# Patient Record
Sex: Male | Born: 1980
Health system: Southern US, Community
[De-identification: ages and names within clinical notes are randomized; demographics above are authoritative.]

## PROBLEM LIST (undated history)

## (undated) DIAGNOSIS — B019 Varicella without complication: Secondary | ICD-10-CM

## (undated) DIAGNOSIS — R030 Elevated blood-pressure reading, without diagnosis of hypertension: Secondary | ICD-10-CM

## (undated) DIAGNOSIS — K219 Gastro-esophageal reflux disease without esophagitis: Secondary | ICD-10-CM

## (undated) DIAGNOSIS — I10 Essential (primary) hypertension: Secondary | ICD-10-CM

## (undated) DIAGNOSIS — F419 Anxiety disorder, unspecified: Secondary | ICD-10-CM

## (undated) HISTORY — DX: Elevated blood-pressure reading, without diagnosis of hypertension: R03.0

## (undated) HISTORY — DX: Gastro-esophageal reflux disease without esophagitis: K21.9

## (undated) HISTORY — PX: OTHER SURGICAL HISTORY: SHX169

## (undated) HISTORY — PX: NO PAST SURGERIES: SHX2092

## (undated) HISTORY — PX: JOINT REPLACEMENT: SHX530

## (undated) HISTORY — DX: Varicella without complication: B01.9

---

## 2016-06-28 ENCOUNTER — Encounter: Payer: Self-pay | Admitting: Family Medicine

## 2016-06-28 ENCOUNTER — Ambulatory Visit (INDEPENDENT_AMBULATORY_CARE_PROVIDER_SITE_OTHER): Payer: BLUE CROSS/BLUE SHIELD | Admitting: Family Medicine

## 2016-06-28 VITALS — BP 138/78 | HR 81 | Temp 98.3°F | Ht 74.25 in | Wt 203.8 lb

## 2016-06-28 DIAGNOSIS — R030 Elevated blood-pressure reading, without diagnosis of hypertension: Secondary | ICD-10-CM

## 2016-06-28 DIAGNOSIS — I10 Essential (primary) hypertension: Secondary | ICD-10-CM | POA: Insufficient documentation

## 2016-06-28 DIAGNOSIS — K219 Gastro-esophageal reflux disease without esophagitis: Secondary | ICD-10-CM | POA: Diagnosis not present

## 2016-06-28 DIAGNOSIS — Z0000 Encounter for general adult medical examination without abnormal findings: Secondary | ICD-10-CM | POA: Diagnosis not present

## 2016-06-28 LAB — LIPID PANEL
CHOL/HDL RATIO: 3
Cholesterol: 233 mg/dL — ABNORMAL HIGH (ref 0–200)
HDL: 70 mg/dL (ref >39.00)
LDL CALC: 150 mg/dL — AB (ref 0–99)
NONHDL: 163.45
TRIGLYCERIDES: 66 mg/dL (ref 0.0–149.0)
VLDL: 13.2 mg/dL (ref 0.0–40.0)

## 2016-06-28 LAB — CBC
HCT: 46.8 % (ref 39.0–52.0)
Hemoglobin: 16.1 g/dL (ref 13.0–17.0)
MCHC: 34.3 g/dL (ref 30.0–36.0)
MCV: 91 fl (ref 78.0–100.0)
PLATELETS: 234 10*3/uL (ref 150.0–400.0)
RBC: 5.15 Mil/uL (ref 4.22–5.81)
RDW: 13 % (ref 11.5–15.5)
WBC: 5.2 10*3/uL (ref 4.0–10.5)

## 2016-06-28 LAB — POC URINALSYSI DIPSTICK (AUTOMATED)
BILIRUBIN UA: NEGATIVE
GLUCOSE UA: NEGATIVE
KETONES UA: NEGATIVE
Leukocytes, UA: NEGATIVE
Nitrite, UA: NEGATIVE
RBC UA: NEGATIVE
SPEC GRAV UA: 1.015
Urobilinogen, UA: 0.2
pH, UA: 8.5

## 2016-06-28 LAB — COMPREHENSIVE METABOLIC PANEL
ALT: 25 U/L (ref 0–53)
AST: 22 U/L (ref 0–37)
Albumin: 4.6 g/dL (ref 3.5–5.2)
Alkaline Phosphatase: 55 U/L (ref 39–117)
BILIRUBIN TOTAL: 1.1 mg/dL (ref 0.2–1.2)
BUN: 14 mg/dL (ref 6–23)
CHLORIDE: 103 meq/L (ref 96–112)
CO2: 30 meq/L (ref 19–32)
CREATININE: 1.03 mg/dL (ref 0.40–1.50)
Calcium: 9.6 mg/dL (ref 8.4–10.5)
GFR: 87.67 mL/min (ref >60.00)
Glucose, Bld: 90 mg/dL (ref 70–99)
Potassium: 4 mEq/L (ref 3.5–5.1)
SODIUM: 139 meq/L (ref 135–145)
Total Protein: 7.5 g/dL (ref 6.0–8.3)

## 2016-06-28 NOTE — Patient Instructions (Addendum)
Blood pressure on high side but not enough to diagnose hypertension  Labs before you leave  Would advise 150 minutes of exercise a week to help lower BP as well as DASH eating plan  Sign for mychart   Goal <140/90 to stay off medicine- update me in 8 weeks with a few home blood pressures from wife  And also at that time tell me how behind the ear is doing- dermatology referral if hasnt resolved  DASH Eating Plan DASH stands for "Dietary Approaches to Stop Hypertension." The DASH eating plan is a healthy eating plan that has been shown to reduce high blood pressure (hypertension). Additional health benefits may include reducing the risk of type 2 diabetes mellitus, heart disease, and stroke. The DASH eating plan may also help with weight loss. WHAT DO I NEED TO KNOW ABOUT THE DASH EATING PLAN? For the DASH eating plan, you will follow these general guidelines:  Choose foods with a percent daily value for sodium of less than 5% (as listed on the food label).  Use salt-free seasonings or herbs instead of table salt or sea salt.  Check with your health care provider or pharmacist before using salt substitutes.  Eat lower-sodium products, often labeled as "lower sodium" or "no salt added."  Eat fresh foods.  Eat more vegetables, fruits, and low-fat dairy products.  Choose whole grains. Look for the word "whole" as the first word in the ingredient list.  Choose fish and skinless chicken or [place] more often than red meat. Limit fish, poultry, and meat to 6 oz (170 g) each day.  Limit sweets, desserts, sugars, and sugary drinks.  Choose heart-healthy fats.  Limit cheese to 1 oz (28 g) per day.  Eat more home-cooked food and less restaurant, buffet, and fast food.  Limit fried foods.  Cook foods using methods other than frying.  Limit canned vegetables. If you do use them, rinse them well to decrease the sodium.  When eating at a restaurant, ask that your food be prepared with  less salt, or no salt if possible. WHAT FOODS CAN I EAT? Seek help from a dietitian for individual calorie needs. Grains Whole grain or whole wheat bread. Brown rice. Whole grain or whole wheat pasta. Quinoa, bulgur, and whole grain cereals. Low-sodium cereals. Corn or whole wheat flour tortillas. Whole grain cornbread. Whole grain crackers. Low-sodium crackers. Vegetables Fresh or frozen vegetables (raw, steamed, roasted, or grilled). Low-sodium or reduced-sodium tomato and vegetable juices. Low-sodium or reduced-sodium tomato sauce and paste. Low-sodium or reduced-sodium canned vegetables.  Fruits All fresh, canned (in natural juice), or frozen fruits. Meat and Other Protein Products Ground beef (85% or leaner), grass-fed beef, or beef trimmed of fat. Skinless chicken or [place]. Ground chicken or [place]. Pork trimmed of fat. All fish and seafood. Eggs. Dried beans, peas, or lentils. Unsalted nuts and seeds. Unsalted canned beans. Dairy Low-fat dairy products, such as skim or 1% milk, 2% or reduced-fat cheeses, low-fat ricotta or cottage cheese, or plain low-fat yogurt. Low-sodium or reduced-sodium cheeses. Fats and Oils Tub margarines without trans fats. Light or reduced-fat mayonnaise and salad dressings (reduced sodium). Avocado. Safflower, olive, or canola oils. Natural peanut or almond butter. Other Unsalted popcorn and pretzels. The items listed above may not be a complete list of recommended foods or beverages. Contact your dietitian for more options. WHAT FOODS ARE NOT RECOMMENDED? Grains White bread. White pasta. White rice. Refined cornbread. Bagels and croissants. Crackers that contain trans fat. Vegetables Creamed or fried  vegetables. Vegetables in a cheese sauce. Regular canned vegetables. Regular canned tomato sauce and paste. Regular tomato and vegetable juices. Fruits Dried fruits. Canned fruit in light or heavy syrup. Fruit juice. Meat and Other Protein Products Fatty cuts  of meat. Ribs, chicken wings, bacon, sausage, bologna, salami, chitterlings, fatback, hot dogs, bratwurst, and packaged luncheon meats. Salted nuts and seeds. Canned beans with salt. Dairy Whole or 2% milk, cream, half-and-half, and cream cheese. Whole-fat or sweetened yogurt. Full-fat cheeses or blue cheese. Nondairy creamers and whipped toppings. Processed cheese, cheese spreads, or cheese curds. Condiments Onion and garlic salt, seasoned salt, table salt, and sea salt. Canned and packaged gravies. Worcestershire sauce. Tartar sauce. Barbecue sauce. Teriyaki sauce. Soy sauce, including reduced sodium. Steak sauce. Fish sauce. Oyster sauce. Cocktail sauce. Horseradish. Ketchup and mustard. Meat flavorings and tenderizers. Bouillon cubes. Hot sauce. Tabasco sauce. Marinades. Taco seasonings. Relishes. Fats and Oils Butter, stick margarine, lard, shortening, ghee, and bacon fat. Coconut, palm kernel, or palm oils. Regular salad dressings. Other Pickles and olives. Salted popcorn and pretzels. The items listed above may not be a complete list of foods and beverages to avoid. Contact your dietitian for more information. WHERE CAN I FIND MORE INFORMATION? National Heart, Lung, and Blood Institute: CablePromo.itwww.nhlbi.nih.gov/health/health-topics/topics/dash/   This information is not intended to replace advice given to you by your health care provider. Make sure you discuss any questions you have with your health care provider.   Document Released: 08/11/2011 Document Revised: 09/12/2014 Document Reviewed: 06/26/2013 Elsevier Interactive Patient Education [employer]2016 Elsevier Inc.

## 2016-06-28 NOTE — Progress Notes (Signed)
Phone: [phone]  Subjective:  Patient presents today to establish care. Chief complaint-noted.   See problem oriented charting  The following were reviewed and entered/updated in epic: Past Medical History:  Diagnosis Date  . Chicken pox   . Elevated blood pressure reading    intermittent elevations when checking at pharmacy  . GERD (gastroesophageal reflux disease)    tums prn   Patient Active Problem List   Diagnosis Date Noted  . Elevated blood pressure reading     Priority: Medium  . GERD (gastroesophageal reflux disease)    Past Surgical History:  Procedure Laterality Date  . none      Family History  Problem Relation Age of Onset  . Healthy Mother   . Hyperlipidemia Father   . Hypertension Father   . Lung cancer Maternal Grandfather     smoker  . Stroke Paternal Grandmother     led to death mid 4250s  . Hypertension Paternal Grandmother   . Dementia Maternal Grandmother   . Glaucoma Paternal Grandfather     Medications- reviewed and updated No current outpatient prescriptions on file.   No current facility-administered medications for this visit.     Allergies-reviewed and updated No Known Allergies  Social History   Social History  . Marital status: Married    Spouse name: N/A  . Number of children: N/A  . Years of education: N/A   Social History Main Topics  . Smoking status: Former Smoker    Packs/day: 1.00    Years: 6.00    Quit date: 09/05/2010  . Smokeless tobacco: Never Used  . Alcohol use Yes     Comment: Social 2-3  . Drug use: No  . Sexual activity: Yes   Other Topics Concern  . None   Social History Narrative   Married 2015. 1st daughter Otila KluverSloane born 05/10/16.       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       Hobbies: working on networking exam- on backburner for now, hiking, enjoys helping folks with iphone, spending time with child, cars     ROS--Full ROS was completed Review of Systems  Constitutional: Negative for chills and fever.  HENT: Negative for hearing loss.   Eyes: Negative for blurred vision and double vision.  Respiratory: Negative for cough and hemoptysis.   Cardiovascular: Negative for chest pain and palpitations.  Gastrointestinal: Negative for heartburn and nausea.  Genitourinary: Negative for dysuria and urgency.  Musculoskeletal: Negative for myalgias and neck pain.  Skin: Negative for itching and rash.  Neurological: Negative for dizziness, tingling and headaches.  Endo/Heme/Allergies: Negative for polydipsia. Does not bruise/bleed easily.  Psychiatric/Behavioral: Negative for depression and suicidal ideas.   Objective: BP 138/78 (BP Location: Left Arm, Patient Position: Sitting, Cuff Size: Large)   Pulse 81   Temp 98.3 F (36.8 C) (Oral)   Ht 6' 2.25" (1.886 m)   Wt 203 lb 12.8 oz (92.4 kg)   SpO2 99%   BMI 25.99 kg/m  Gen: NAD, resting comfortably HEENT: Mucous membranes are moist. Oropharynx normal. TM normal. Behind right ear 2 x 2 x 2 cm area that appears to be prior drained abscess- still swollen but without warmth or pain Eyes: sclera and lids normal, PERRLA Neck: no thyromegaly, no cervical lymphadenopathy CV: RRR no murmurs rubs or gallops Lungs: CTAB no crackles, wheeze, rhonchi Abdomen: soft/nontender/nondistended/normal bowel sounds. No rebound or guarding.  Ext: no  edema Skin: warm, dry Neuro: 5/5 strength in upper and lower extremities, normal gait, normal reflexes  Assessment/Plan:  35 y.o. male presenting for annual physical.  Health Maintenance counseling: 1. Anticipatory guidance: Patient counseled regarding regular dental exams, eye exams- no issues, wearing seatbelts.  2. Risk factor reduction:  Advised patient of need for regular exercise (encouraged this due to blood pressure running on high side- usually 150 minutes a week) and diet rich and fruits and vegetables  to reduce risk of heart attack and stroke. Advised DASH eating plan.  3. Immunizations/screenings/ancillary studies Immunization History  Administered Date(s) Administered  . Influenza-Unspecified 06/07/2016  . Tdap 03/28/2016   4. Prostate cancer screening- no family history, start at age 79   5. Colon cancer screening - no family history, start at age 72 6. Skin cancer screening- waist up exam without obvious cancer or precancer 7. Testicular cancer screening- advised monthly self exams  Behind right ear has a 2 cm x 2 cm lesion that was prior abscess drained by wife with needle- slowly getting smaller- discussed if not resolved within 8 weeks refer to dermatology  Elevated blood pressure reading intermittent elevations when checking at pharmacy BP Readings from Last 3 Encounters:  06/28/16 138/78  discussed prehypertension range- would focus on dash eating and upping exercise- hard with 36 week old at home. Update me in 2 months by mychart and can update me on behind right ear issue as well  1 year CPE   Orders Placed This Encounter  Procedures  . Lipid panel    [place]    Order Specific Question:   Has the patient fasted?    Answer:   No  . CBC    [place]  . Comprehensive metabolic panel    [place]    Order Specific Question:   Has the patient fasted?    Answer:   No  . POCT Urinalysis Dipstick (Automated)   Return precautions advised.  Tana Conch, MD

## 2016-06-28 NOTE — Assessment & Plan Note (Signed)
intermittent elevations when checking at pharmacy BP Readings from Last 3 Encounters:  06/28/16 138/78  discussed prehypertension range- would focus on dash eating and upping exercise- hard with 427 week old at home. Update me in 2 months by mychart and can update me on behind right ear issue as well

## 2016-06-28 NOTE — Progress Notes (Signed)
Pre visit review using our clinic review tool, if applicable. No additional management support is needed unless otherwise documented below in the visit note. 

## 2018-08-23 ENCOUNTER — Encounter: Payer: Self-pay | Admitting: Family Medicine

## 2018-08-23 ENCOUNTER — Ambulatory Visit (INDEPENDENT_AMBULATORY_CARE_PROVIDER_SITE_OTHER): Payer: 59 | Admitting: Family Medicine

## 2018-08-23 VITALS — BP 144/88 | HR 91 | Temp 98.2°F | Ht 74.25 in | Wt 193.8 lb

## 2018-08-23 DIAGNOSIS — R69 Illness, unspecified: Secondary | ICD-10-CM | POA: Diagnosis not present

## 2018-08-23 DIAGNOSIS — F419 Anxiety disorder, unspecified: Secondary | ICD-10-CM | POA: Diagnosis not present

## 2018-08-23 DIAGNOSIS — R030 Elevated blood-pressure reading, without diagnosis of hypertension: Secondary | ICD-10-CM | POA: Diagnosis not present

## 2018-08-23 MED ORDER — BUPROPION HCL ER (XL) 150 MG PO TB24: 150.0000 mg | ORAL_TABLET | Freq: Every day | ORAL | 2 refills | Status: DC

## 2018-08-23 NOTE — Progress Notes (Signed)
Subjective:  Robert SchimkeStephen Ashley is a 37 y.o. year old very pleasant male patient who presents for/with See problem oriented charting ROS-no chest pain, shortness of breath, headache, blurry vision.  No palpitations.  Past Medical History-  Patient Active Problem List   Diagnosis Date Noted  . Elevated blood pressure reading     Priority: Medium  . Anxiety 08/24/2018  . GERD (gastroesophageal reflux disease)     Medications- reviewed and updated No current outpatient medications on file.   No current facility-administered medications for this visit.     Objective: BP (!) 144/88   Pulse 91   Temp 98.2 F (36.8 C) (Oral)   Ht 6' 2.25" (1.886 m)   Wt 193 lb 12.8 oz (87.9 kg)   SpO2 99%   BMI 24.72 kg/m  Gen: NAD, resting comfortably CV: RRR no murmurs rubs or gallops Lungs: CTAB no crackles, wheeze, rhonchi Abdomen: soft/nontender/nondistended Ext: no edema Skin: warm, dry Psych-slightly anxious appearing  Assessment/Plan:   Elevated blood pressure reading S: Poorly controlled on no medication- initial systolic was actually 160  Wellness check at work was 130/70.  BP Readings from Last 3 Encounters:  08/23/18 (!) 144/88  06/28/16 138/78  A/P: We discussed blood pressure goal of <140/90.  Given prior encouraging measurement at home and fact that his wife is a physician's assistant-we are going to do some home monitoring for more information.  We will follow-up in 4 to 6 weeks- I do not want to consider stimulant unless blood pressures less than 135/85.  Stimulants are far more likely to cause hypertension than Wellbutrin though there are some isolated cases of elevations on Wellbutrin.  Anxiety S: Stress/anxiety/depressed mood reported for several months.  Unfortunately this has worsened recently as patients job is in jeopardy- advanced home care was brought out and he will likely have to transition out.  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 Diagn[facility]. He has been able to handle it previously but job s getting really more difficult- getting up and wandering/not listening. He took adderral 20mg  TID at that time and was very helpful.  He thinks the inability to focus is the primary driver of his anxiety and discouragement A/P: Patient reports history of ADD- we need to confirm this diagnosis with prior records are adult evaluation.  If blood pressure is excellent at home-would consider stimulant medication.  While we are working on evaluating blood pressure and getting records-we will try Wellbutrin to help with depressed mood, ADD.  We discussed in some patients that helps with anxiety and and others it makes it worse-we will see how he does. - If we do use stimulants we will need controlled substance contract, UDS, copy of original records in the chart    Patient Instructions  Try to get in touch with [facility]- we need a copy of your original diagnosis of ADD before prescribing stimulant medications.  If you are unable to obtain this-please see handout and call Dr. Jason FilaBray for adult ADD evaluation.  The other concern about prescribing stimulants is your blood pressure-I want you to get a home cuff as tour blood pressure trend concerns me. Check at least 4x a week. Your goal is <135/85 to even consider ADD stimulant medications. If you note  in the next few weeks that it is higher than our goal, see me sooner. Otherwise, see me in 4-6 weeks. Bring your home cuff and your log of blood pressures with you to visit.   I want you to start Wellbutrin-this can help with depressed mood, concentration issues-though not as potent as Adderall-some people will note improvement in anxiety on this medicine while others will note a mild increase in  anxiety.  Please let me know if the increase in anxiety is not tolerable.  Taking the medicine as directed and not missing any doses is one of the best things you can do to treat your attention issues/depressed mood/anxiety.  Here are some things to keep in mind:  1) Side effects (stomach upset, some increased anxiety) may happen before you notice a benefit.  These side effects typically go away over time. 2) Changes to your dose of medicine or a change in medication all together is sometimes necessary 3) Most people need to be on medication at least 6-12 months 4) Many people will notice an improvement within two weeks but the full effect of the medication can take up to 4-6 weeks 5) Stopping the medication when you start feeling better often results in a return of symptoms 6) If you start having thoughts of hurting yourself or others after starting this medicine, call our office immediately at [phone] or seek care through 911.   7. Luckily this medication doesn't have sexual side effects like the other depression/anxiety medicines   Future Appointments  Date Time Provider Department Center  10/05/2018  4:00 PM Shelva MajesticHunter, Ladanian O, MD [facility]  4 to 6-week follow-up evaluation  Lab/Order associations: Elevated blood pressure reading  Anxiety  Meds ordered this encounter  Medications  . buPROPion (WELLBUTRIN XL) 150 MG 24 hr tablet    Sig: Take 1 tablet (150 mg total) by mouth daily.    Dispense:  30 tablet    Refill:  2   Return precautions advised.  Tana ConchStephen Ballard Budney, MD

## 2018-08-23 NOTE — Patient Instructions (Addendum)
Trying to get in touch with [facility]- we need a copy of your original diagnosis of ADD before prescribing stimulant medications.  If you are unable to obtain this-please see handout and call Dr. Jason FilaBray for adult ADD evaluation.  The other concern about prescribing stimulants is your blood pressure-I want you to get a home cuff as tour blood pressure trend concerns me. Check at least 4x a week. Your goal is <135/85 to even consider ADD stimulant medications. If you note in the next few weeks that it is higher than our goal, see me sooner. Otherwise, see me in 4-6 weeks. Bring your home cuff and your log of blood pressures with you to visit.   I want you to start Wellbutrin-this can help with depressed mood, concentration issues-though not as potent as Adderall-some people will note improvement in anxiety on this medicine while others will note a mild increase in anxiety.  Please let me know if the increase in anxiety is not tolerable.  Taking the medicine as directed and not missing any doses is one of the best things you can do to treat your attention issues/depressed mood/anxiety.  Here are some things to keep in mind:  1) Side effects (stomach upset, some increased anxiety) may happen before you notice a benefit.  These side effects typically go away over time. 2) Changes to your dose of medicine or a change in medication all together is sometimes necessary 3) Most people need to be on medication at least 6-12 months 4) Many people will notice an improvement within two weeks but the full effect of the medication can take up to 4-6 weeks 5) Stopping the medication when you start feeling better often results in a return of symptoms 6) If you start having thoughts of hurting yourself or others after starting this medicine, call our office immediately at [phone] or seek care through 911.   7. Luckily this medication doesn't have sexual side effects like the other depression/anxiety medicines

## 2018-08-24 DIAGNOSIS — F419 Anxiety disorder, unspecified: Secondary | ICD-10-CM | POA: Insufficient documentation

## 2018-08-24 DIAGNOSIS — F411 Generalized anxiety disorder: Secondary | ICD-10-CM | POA: Insufficient documentation

## 2018-08-24 NOTE — Assessment & Plan Note (Signed)
S: Poorly controlled on no medication- initial systolic was actually 160  Wellness check at work was 130/70.  BP Readings from Last 3 Encounters:  08/23/18 (!) 144/88  06/28/16 138/78  A/P: We discussed blood pressure goal of <140/90.  Given prior encouraging measurement at home and fact that his wife is a physician's assistant-we are going to do some home monitoring for more information.  We will follow-up in 4 to 6 weeks- I do not want to consider stimulant unless blood pressures less than 135/85.  Stimulants are far more likely to cause hypertension than Wellbutrin though there are some isolated cases of elevations on Wellbutrin.

## 2018-08-24 NOTE — Assessment & Plan Note (Signed)
S: Stress/anxiety/depressed mood reported for several months.  Unfortunately this has worsened recently as patients job is in jeopardy- advanced home care was brought out and he will likely have to transition out.  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 Diagnosed from ps[facility]atrist [facility]. He has been able to handle it previously but job s getting really more difficult- getting up and wandering/not listening. He took adderral 20mg  TID at that time and was very helpful.  He thinks the inability to focus is the primary driver of his anxiety and discouragement A/P: Patient reports history of ADD- we need to confirm this diagnosis with prior records are adult evaluation.  If blood pressure is excellent at home-would consider stimulant medication.  While we are working on evaluating blood pressure and getting records-we will try Wellbutrin to help with depressed mood, ADD.  We discussed in some patients that helps with anxiety and and others it makes it worse-we will see how he does. - If we do use stimulants we will need controlled substance contract, UDS, copy of original records in the chart

## 2018-10-05 ENCOUNTER — Ambulatory Visit: Payer: 59 | Admitting: Family Medicine

## 2018-10-05 ENCOUNTER — Encounter: Payer: Self-pay | Admitting: Family Medicine

## 2018-10-05 VITALS — BP 132/88 | HR 84 | Temp 98.5°F | Ht 74.2 in | Wt 193.6 lb

## 2018-10-05 DIAGNOSIS — F419 Anxiety disorder, unspecified: Secondary | ICD-10-CM

## 2018-10-05 DIAGNOSIS — R030 Elevated blood-pressure reading, without diagnosis of hypertension: Secondary | ICD-10-CM | POA: Diagnosis not present

## 2018-10-05 DIAGNOSIS — R69 Illness, unspecified: Secondary | ICD-10-CM | POA: Diagnosis not present

## 2018-10-05 NOTE — Progress Notes (Signed)
  Phone [phone]   Subjective:  Robert Ashley is a 38 y.o. year old very pleasant male patient who presents for/with See problem oriented charting ROS-some anxiety and anhedonia but overall improved.  No chest pain or shortness of breath reported.   Past Medical History-  Patient Active Problem List   Diagnosis Date Noted  . Elevated blood pressure reading     Priority: Medium  . Anxiety 08/24/2018  . GERD (gastroesophageal reflux disease)     Medications- reviewed and updated Current Outpatient Medications  Medication Sig Dispense Refill  . buPROPion (WELLBUTRIN XL) 150 MG 24 hr tablet Take 1 tablet (150 mg total) by mouth daily. 30 tablet 2   No current facility-administered medications for this visit.      Objective:  BP 132/88   Pulse 84   Temp 98.5 F (36.9 C) (Oral)   Ht 6' 2.2" (1.885 m)   Wt 193 lb 9.6 oz (87.8 kg)   SpO2 98%   BMI 24.72 kg/m  Gen: NAD, resting comfortably CV: RRR no murmurs rubs or gallops Lungs: CTAB no crackles, wheeze, rhonchi Ext: no edema Skin: warm, dry Psych: Not anxious appearing    Assessment and Plan   Anxiety Depressed mood  Inattention  s: Patient has noted significant improvement in anxiety/depressed mood with Mild improvement in focus on Wellbutrin 150 mg extended release  Continues work as Psychiatric nurse- seems like he is with the right people in conversation but he is nervous until gets final verdict by march (big Pharmacist, community). He would like to do CCNA testing but cant do right now- things are actually even bigger at work.  A/P: Significant improvement in depressed mood and anxiety-continue Wellbutrin 150 mg extended release - Inattention is slightly better but he will pursue adult ADD testing -We could consider Adderall 10 to 20 mg instant release up to twice a day -Would need UDS, controlled substance contract in addition to adult ADD testing records -See elevated blood pressure section  Elevated blood  pressure reading S: We had wanted to make sure that if we started ADD medication that home blood pressure controlled.Home blood pressure 130s over 85 or so for the most part.  He reports prior adderall 20mg  3x a day-he thinks he can do better with a lower dose now A/P: Blood pressure also controlled here though high normal.  I do think we could trial a stimulant and then do home monitoring to ensure it remains controlled  Return precautions advised.  Tana Conch, MD

## 2018-10-05 NOTE — Assessment & Plan Note (Signed)
S: We had wanted to make sure that if we started ADD medication that home blood pressure controlled.Home blood pressure 130s over 85 or so for the most part.  He reports prior adderall 20mg  3x a day-he thinks he can do better with a lower dose now A/P: Blood pressure also controlled here though high normal.  I do think we could trial a stimulant and then do home monitoring to ensure it remains controlled

## 2018-10-05 NOTE — Assessment & Plan Note (Signed)
Depressed mood  Inattention  s: Patient has noted significant improvement in anxiety/depressed mood with Mild improvement in focus on Wellbutrin 150 mg extended release  Continues work as Psychiatric nurse- seems like he is with the right people in conversation but he is nervous until gets final verdict by march (big Pharmacist, community). He would like to do CCNA testing but cant do right now- things are actually even bigger at work.  A/P: Significant improvement in depressed mood and anxiety-continue Wellbutrin 150 mg extended release - Inattention is slightly better but he will pursue adult ADD testing -We could consider Adderall 10 to 20 mg instant release up to twice a day -Would need UDS, controlled substance contract in addition to adult ADD testing records -See elevated blood pressure section

## 2018-10-05 NOTE — Patient Instructions (Signed)
Thrilled blood pressure is better at home- this gives us space to trial adderall potentially  Lets get adult eval for ADD  Glad wellbutrin is working- continue current medicine  Lets sit back down once we have the adult eval and reevaluate.

## 2018-11-13 ENCOUNTER — Other Ambulatory Visit: Payer: Self-pay | Admitting: Family Medicine

## 2018-12-19 ENCOUNTER — Encounter: Payer: Self-pay | Admitting: Physical Therapy

## 2018-12-19 DIAGNOSIS — F902 Attention-deficit hyperactivity disorder, combined type: Secondary | ICD-10-CM | POA: Insufficient documentation

## 2019-01-23 ENCOUNTER — Other Ambulatory Visit: Payer: Self-pay | Admitting: Family Medicine

## 2019-01-23 NOTE — Telephone Encounter (Signed)
Last OV 10/05/18 Last refill 11/13/18 #30/1 Next OV not scheduled

## 2019-03-21 ENCOUNTER — Ambulatory Visit: Payer: Self-pay | Admitting: Psychology

## 2019-04-05 ENCOUNTER — Ambulatory Visit: Payer: Self-pay | Admitting: Psychology

## 2019-04-21 ENCOUNTER — Other Ambulatory Visit: Payer: Self-pay | Admitting: Family Medicine

## 2019-04-22 NOTE — Telephone Encounter (Signed)
Spoke to pt to schedule ov pt stated he will call back to schedule appt. Pt also declined the refilled and stated he doesn't need any right now.

## 2019-12-26 DIAGNOSIS — F419 Anxiety disorder, unspecified: Secondary | ICD-10-CM | POA: Diagnosis not present

## 2019-12-26 DIAGNOSIS — Z79899 Other long term (current) drug therapy: Secondary | ICD-10-CM | POA: Diagnosis not present

## 2019-12-26 DIAGNOSIS — F902 Attention-deficit hyperactivity disorder, combined type: Secondary | ICD-10-CM | POA: Diagnosis not present

## 2020-02-18 ENCOUNTER — Emergency Department (HOSPITAL_COMMUNITY)
Admission: EM | Admit: 2020-02-18 | Discharge: 2020-02-18 | Disposition: A | Discharge status: Home / Self Care | Payer: BC Managed Care – PPO | Attending: Emergency Medicine | Admitting: Emergency Medicine

## 2020-02-18 ENCOUNTER — Encounter (HOSPITAL_COMMUNITY): Payer: Self-pay | Admitting: Emergency Medicine

## 2020-02-18 ENCOUNTER — Other Ambulatory Visit: Payer: Self-pay

## 2020-02-18 ENCOUNTER — Telehealth: Payer: Self-pay | Admitting: Family Medicine

## 2020-02-18 DIAGNOSIS — R030 Elevated blood-pressure reading, without diagnosis of hypertension: Secondary | ICD-10-CM | POA: Insufficient documentation

## 2020-02-18 DIAGNOSIS — F419 Anxiety disorder, unspecified: Secondary | ICD-10-CM | POA: Diagnosis not present

## 2020-02-18 DIAGNOSIS — I10 Essential (primary) hypertension: Secondary | ICD-10-CM

## 2020-02-18 DIAGNOSIS — Z87891 Personal history of nicotine dependence: Secondary | ICD-10-CM | POA: Diagnosis not present

## 2020-02-18 LAB — CBC WITH DIFFERENTIAL/PLATELET
Abs Immature Granulocytes: 0.02 10*3/uL (ref 0.00–0.07)
Basophils Absolute: 0 10*3/uL (ref 0.0–0.1)
Basophils Relative: 1 %
Eosinophils Absolute: 0.1 10*3/uL (ref 0.0–0.5)
Eosinophils Relative: 2 %
HCT: 47.6 % (ref 39.0–52.0)
Hemoglobin: 16 g/dL (ref 13.0–17.0)
Immature Granulocytes: 0 %
Lymphocytes Relative: 28 %
Lymphs Abs: 2.1 10*3/uL (ref 0.7–4.0)
MCH: 30.8 pg (ref 26.0–34.0)
MCHC: 33.6 g/dL (ref 30.0–36.0)
MCV: 91.7 fL (ref 80.0–100.0)
Monocytes Absolute: 0.6 10*3/uL (ref 0.1–1.0)
Monocytes Relative: 8 %
Neutro Abs: 4.7 10*3/uL (ref 1.7–7.7)
Neutrophils Relative %: 61 %
Platelets: 247 10*3/uL (ref 150–400)
RBC: 5.19 MIL/uL (ref 4.22–5.81)
RDW: 12.4 % (ref 11.5–15.5)
WBC: 7.5 10*3/uL (ref 4.0–10.5)
nRBC: 0 % (ref 0.0–0.2)

## 2020-02-18 LAB — BASIC METABOLIC PANEL
Anion gap: 10 (ref 5–15)
BUN: 12 mg/dL (ref 6–20)
CO2: 27 mmol/L (ref 22–32)
Calcium: 9.2 mg/dL (ref 8.9–10.3)
Chloride: 103 mmol/L (ref 98–111)
Creatinine, Ser: 1.01 mg/dL (ref 0.61–1.24)
GFR calc Af Amer: >60 mL/min (ref >60)
GFR calc non Af Amer: >60 mL/min (ref >60)
Glucose, Bld: 86 mg/dL (ref 70–99)
Potassium: 3.7 mmol/L (ref 3.5–5.1)
Sodium: 140 mmol/L (ref 135–145)

## 2020-02-18 LAB — TROPONIN I (HIGH SENSITIVITY): Troponin I (High Sensitivity): <2 ng/L (ref <18)

## 2020-02-18 MED ORDER — HYDROXYZINE HCL 25 MG PO TABS
25.0000 mg | ORAL_TABLET | Freq: Four times a day (QID) | ORAL | 0 refills | Status: DC | PRN
Start: 2020-02-18 — End: 2020-02-20

## 2020-02-18 NOTE — ED Notes (Signed)
Patient Alert and oriented to baseline. Stable and ambulatory to baseline. Patient verbalized understanding of the discharge instructions.  Patient belongings were taken by the patient.   

## 2020-02-18 NOTE — Telephone Encounter (Signed)
Called and spoke with pt and he was currently in the ER.

## 2020-02-18 NOTE — Telephone Encounter (Signed)
Nurse Assessment Nurse: Annye English RN, Denise Date/Time (Eastern Time): 02/18/2020 2:51:10 PM Confirm and document reason for call. If symptomatic, describe symptoms. ---Pt states HBP 137/101 and last taken was 140/93. Denies s/s. Has the patient had close contact with a person known or suspected to have the novel coronavirus illness OR traveled / lives in area with major community spread (including international travel) in the last 14 days from the onset of symptoms? * If Asymptomatic, screen for exposure and travel within the last 14 days. ---No Does the patient have any new or worsening symptoms? ---Yes Will a triage be completed? ---Yes Related visit to physician within the last 2 weeks? ---No Does the PT have any chronic conditions? (i.e. diabetes, asthma, this includes High risk factors for pregnancy, etc.) ---No Is this a behavioral health or substance abuse call? ---No Guidelines Guideline Title Affirmed Question Affirmed Notes Nurse Date/Time (Eastern Time) Blood Pressure - High [1] Numbness (i.e., loss of sensation) of the face, arm or leg on one side of the body AND [2] new-onset Carmon, RN, Angelique Blonder 02/18/2020 2:52:32 PM Disp. Time Lamount Cohen Time) Disposition Final User 02/18/2020 2:56:14 PM 911 Outcome Documentation Carmon, RN, DenisePLEASE NOTE: All timestamps contained within this report are represented as [place] Standard Time. CONFIDENTIALTY NOTICE: This fax transmission is intended only for the addressee. It contains information that is legally privileged, confidential or otherwise protected from use or disclosure. If you are not the intended recipient, you are strictly prohibited from reviewing, disclosing, copying using or disseminating any of this information or taking any action in reliance on or regarding this information. If you have received this fax in error, please notify us immediately by telephone so that we can arrange for its return to [place]. Phone: [phone],  Toll-Free: [phone], Fax: [phone] Page: 2 of 2 Call Id: [id number] Disp. Time (Eastern Time) Disposition Final User Reason: Pt refused to call 911 and having spouse take him to ER 02/18/2020 2:54:34 PM Call EMS 911 Now Yes Carmon, RN, Leighton Ruff Disagree/Comply Disagree Caller Understands Yes PreDisposition Call Doctor Care Advice Given Per Guideline CALL EMS 911 NOW: CARE ADVICE given per High Blood Pressure (Adult) guideline.

## 2020-02-18 NOTE — ED Triage Notes (Signed)
Pt reports his BP was elevated today while at work today. Pt reports an increase in stress in the last month. Denies chest pain, shortness of breath, headache.

## 2020-02-18 NOTE — ED Provider Notes (Signed)
[facility] EMERGENCY DEPARTMENT Provider Note   CSN: [id number] Arrival date & time: 02/18/20  1519     History Chief Complaint  Patient presents with  . Hypertension    Robert Ashley is a 38 y.o. male.  The history is provided by the patient and medical records. No language interpreter was used.   Robert Ashley is a 39 y.o. male who presents to the Emergency Department complaining of high blood pressure that started earlier today. He is currently working from home and has been under increased stress. When he was at home he developed a hot sensation to his left deltoid region. He googled the symptoms was concerned for heart attack infectious blood pressure is noted to be elevated at 580 systolic. He rejected again it was 998 systolic. Overall that hot and warm sensation to the left deltoid region has resolved. He denies any associated headache, vision changes, chest pain, shortness of breath, abdominal pain, nausea, vomiting, leg swelling or pain. He has no known medical problems. He does take ADHD medicines. He does not smoke. He drinks occasional alcohol, no street drugs. No family history of coronary artery disease. No prior similar symptoms.    Past Medical History:  Diagnosis Date  . Chicken pox   . Elevated blood pressure reading    intermittent elevations when checking at pharmacy  . GERD (gastroesophageal reflux disease)    tums prn    Patient Active Problem List   Diagnosis Date Noted  . ADHD (attention deficit hyperactivity disorder), combined type 12/19/2018  . Anxiety 08/24/2018  . Elevated blood pressure reading   . GERD (gastroesophageal reflux disease)     Past Surgical History:  Procedure Laterality Date  . none         Family History  Problem Relation Age of Onset  . Healthy Mother   . Hyperlipidemia Father   . Hypertension Father   . Lung cancer Maternal Grandfather        smoker  . Stroke Paternal Grandmother        led  to death mid 24s  . Hypertension Paternal Grandmother   . Dementia Maternal Grandmother   . Glaucoma Paternal Grandfather     Social History   Tobacco Use  . Smoking status: Former Smoker    Packs/day: 1.00    Years: 6.00    Pack years: 6.00    Quit date: 09/05/2010    Years since quitting: 9.4  . Smokeless tobacco: Never Used  Substance Use Topics  . Alcohol use: Yes    Comment: Social 2-3  . Drug use: No    Home Medications Prior to Admission medications   Medication Sig Start Date End Date Taking? Authorizing Provider  buPROPion (WELLBUTRIN XL) 150 MG 24 hr tablet TAKE 1 TABLET BY MOUTH EVERY DAY 01/23/19   Marin Olp, MD    Allergies    Patient has no known allergies.  Review of Systems   Review of Systems  All other systems reviewed and are negative.   Physical Exam Updated Vital Signs BP (!) 140/128 (BP Location: Right Arm)   Pulse 74   Temp 98.4 F (36.9 C) (Oral)   Resp 18   Ht 6\' 3"  (1.905 m)   Wt 90.7 kg   SpO2 100%   BMI 25.00 kg/m   Physical Exam Vitals and nursing note reviewed.  Constitutional:      Appearance: He is well-developed.  HENT:     Head: Normocephalic and atraumatic.  Cardiovascular:     Rate and Rhythm: Normal rate and regular rhythm.     Heart sounds: No murmur heard.   Pulmonary:     Effort: Pulmonary effort is normal. No respiratory distress.     Breath sounds: Normal breath sounds.  Abdominal:     Palpations: Abdomen is soft.     Tenderness: There is no abdominal tenderness. There is no guarding or rebound.  Musculoskeletal:        General: No swelling or tenderness.     Comments: 2+ radial pulses bilaterally  Skin:    General: Skin is warm and dry.  Neurological:     Mental Status: He is alert and oriented to person, place, and time.  Psychiatric:        Behavior: Behavior normal.     ED Results / Procedures / Treatments   Labs (all labs ordered are listed, but only abnormal results are displayed) Labs  Reviewed  BASIC METABOLIC PANEL  CBC WITH DIFFERENTIAL/PLATELET  TROPONIN I (HIGH SENSITIVITY)    EKG EKG Interpretation  Date/Time:  Tuesday February 18 2020 15:24:33 EDT Ventricular Rate:  89 PR Interval:  142 QRS Duration: 84 QT Interval:  360 QTC Calculation: 438 R Axis:   112 Text Interpretation: Normal sinus rhythm Left posterior fascicular block Abnormal ECG no prior available for comparison Confirmed by Tilden Fossa [phone]) on 02/18/2020 8:03:26 PM   Radiology No results found.  Procedures Procedures (including critical care time)  Medications Ordered in ED Medications - No data to display  ED Course  I have reviewed the triage vital signs and the nursing notes.  Pertinent labs & imaging results that were available during my care of the patient were reviewed by me and considered in my medical decision making (see chart for details).    MDM Rules/Calculators/A&P                         Patient here for evaluation following elevated blood pressure readings at home. He did have some warm sensation to the left arm. He is neurologically intact on evaluation with no evidence of acute deficits to the left upper extremity. EKG is without acute ischemic changes. Troponin is negative. Current presentation is not consistent with ACS, PE, dissection, CVA, hypertensive urgency. Discussed with patient elevated blood pressure as well as stress. Feel that stress may be contributing to some of his symptoms. Discussed outpatient follow-up.  Final Clinical Impression(s) / ED Diagnoses Final diagnoses:  Elevated blood pressure reading in office with diagnosis of hypertension    Rx / DC Orders ED Discharge Orders    None       Tilden Fossa, MD 02/18/20 2143

## 2020-02-20 ENCOUNTER — Encounter: Payer: Self-pay | Admitting: Family Medicine

## 2020-02-20 ENCOUNTER — Ambulatory Visit (INDEPENDENT_AMBULATORY_CARE_PROVIDER_SITE_OTHER): Payer: BC Managed Care – PPO | Admitting: Family Medicine

## 2020-02-20 ENCOUNTER — Other Ambulatory Visit: Payer: Self-pay

## 2020-02-20 VITALS — BP 142/88 | HR 84 | Temp 98.3°F | Ht 75.0 in | Wt 208.6 lb

## 2020-02-20 DIAGNOSIS — R030 Elevated blood-pressure reading, without diagnosis of hypertension: Secondary | ICD-10-CM

## 2020-02-20 DIAGNOSIS — R202 Paresthesia of skin: Secondary | ICD-10-CM

## 2020-02-20 NOTE — Progress Notes (Signed)
°  Phone [phone] In person visit   Subjective:   Robert Ashley is a 39 y.o. year old very pleasant male patient who presents for/with See problem oriented charting Chief Complaint  Patient presents with   Follow-up   ER f/u   This visit occurred during the SARS-CoV-2 public health emergency.  Safety protocols were in place, including screening questions prior to the visit, additional usage of staff PPE, and extensive cleaning of exam room while observing appropriate contact time as indicated for disinfecting solutions.   Past Medical History-  Patient Active Problem List   Diagnosis Date Noted   Elevated blood pressure reading     Priority: Medium   ADHD (attention deficit hyperactivity disorder), combined type 12/19/2018   Anxiety 08/24/2018   GERD (gastroesophageal reflux disease)     Medications- reviewed and updated No current outpatient medications on file.   No current facility-administered medications for this visit.     Objective:  BP (!) 142/88    Pulse 84    Temp 98.3 F (36.8 C)    Ht 6\' 3"  (1.905 m)    Wt 208 lb 9.6 oz (94.6 kg)    SpO2 98%    BMI 26.07 kg/m  Gen: NAD, resting comfortably Blood pressure checked in both left and right arm and similar fluctuating from 138-142/88-92    Assessment and Plan   #Elevated blood pressure/ER follow up  S: medication:  none Home readings #s:  Mainly 140s at home. Diastolic between .   pt was seen in the ER for elevated blood pressure. Pt states he is better since being out of the ER.A lot of stress at work and at home (recent medical issues) at that time. Patient has not had recurrent issues since that time. Had a particularly rought conversation with work 35-46 on that day.  He noted warmth in his left shoulder and was concerned about possible heart attack.  Checked his blood pressure at home and was very elevated above 180.  Fortunately by the time of emergency room visit blood pressure trended  down.  In regards to left shoulder sensation-troponin was not elevated and EKG was considered low risk  Reports Not eating as well as he should-high sat. Working from home and more sedentary. He is planning on trying to start walking daily.  BP Readings from Last 3 Encounters:  02/20/20 (!) 142/88  02/18/20 134/84  10/05/18 132/88  A/P: Isolated in office blood pressure reading today.  Has had intermittent issues with elevated blood pressure as well-I am not going to formally diagnosed as having hypertension at this moment unless has recurrent issues with trial of healthier eating (admits to eating poorly and having high salt diet) and regular exercise.  I also think stress played a role in his symptoms and elevated blood pressure-see discussion above  Recommended follow up: Recommended physical in 4 to 6 months  Lab/Order associations:   ICD-10-CM   1. Elevated blood pressure reading  R03.0   2. Arm paresthesia, left  R20.2    Time Spent: 26 minutes of total time (2:55 PM- 3:21 PM) was spent on the date of the encounter performing the following actions: chart review prior to seeing the patient, obtaining history, performing a medically necessary exam, counseling on the treatment plan, placing orders, and documenting in our EHR.   Return precautions advised.  10/07/18, MD

## 2020-02-20 NOTE — Patient Instructions (Addendum)
Lets start walking or exercising in some form on daily basis at least 5 days a week targetting 30 mins  Also need to clean up diet- in particular DASH eating plan has been proven to lower blood pressure so we will focus on that (see below)  Monitor blood pressure at home at least every few days and update me in 1 month with how things are progressing through [place]   Seek care if you have recurrent or new symptoms such as chest pain, shortness of breath, left arm or chest pain, abnormal dizziness, blurry vision  DASH Eating Plan DASH stands for "Dietary Approaches to Stop Hypertension." The DASH eating plan is a healthy eating plan that has been shown to reduce high blood pressure (hypertension). It may also reduce your risk for type 2 diabetes, heart disease, and stroke. The DASH eating plan may also help with weight loss. What are tips for following this plan?  General guidelines  Avoid eating more than 2,300 mg (milligrams) of salt (sodium) a day. If you have hypertension, you may need to reduce your sodium intake to 1,500 mg a day.  Limit alcohol intake to no more than 1 drink a day for nonpregnant women and 2 drinks a day for men. One drink equals 12 oz of beer, 5 oz of wine, or 1 oz of hard liquor.  Work with your health care provider to maintain a healthy body weight or to lose weight. Ask what an ideal weight is for you.  Get at least 30 minutes of exercise that causes your heart to beat faster (aerobic exercise) most days of the week. Activities may include walking, swimming, or biking.  Work with your health care provider or diet and nutrition specialist (dietitian) to adjust your eating plan to your individual calorie needs. Reading food labels   Check food labels for the amount of sodium per serving. Choose foods with less than 5 percent of the Daily Value of sodium. Generally, foods with less than 300 mg of sodium per serving fit into this eating plan.  To find whole  grains, look for the word "whole" as the first word in the ingredient list. Shopping  Buy products labeled as "low-sodium" or "no salt added."  Buy fresh foods. Avoid canned foods and premade or frozen meals. Cooking  Avoid adding salt when cooking. Use salt-free seasonings or herbs instead of table salt or sea salt. Check with your health care provider or pharmacist before using salt substitutes.  Do not fry foods. Cook foods using healthy methods such as baking, boiling, grilling, and broiling instead.  Cook with heart-healthy oils, such as olive, canola, soybean, or sunflower oil. Meal planning  Eat a balanced diet that includes: ? 5 or more servings of fruits and vegetables each day. At each meal, try to fill half of your plate with fruits and vegetables. ? Up to 6-8 servings of whole grains each day. ? Less than 6 oz of lean meat, poultry, or fish each day. A 3-oz serving of meat is about the same size as a deck of cards. One egg equals 1 oz. ? 2 servings of low-fat dairy each day. ? A serving of nuts, seeds, or beans 5 times each week. ? Heart-healthy fats. Healthy fats called Omega-3 fatty acids are found in foods such as flaxseeds and coldwater fish, like sardines, salmon, and mackerel.  Limit how much you eat of the following: ? Canned or prepackaged foods. ? Food that is high in trans fat,  such as fried foods. ? Food that is high in saturated fat, such as fatty meat. ? Sweets, desserts, sugary drinks, and other foods with added sugar. ? Full-fat dairy products.  Do not salt foods before eating.  Try to eat at least 2 vegetarian meals each week.  Eat more home-cooked food and less restaurant, buffet, and fast food.  When eating at a restaurant, ask that your food be prepared with less salt or no salt, if possible. What foods are recommended? The items listed may not be a complete list. Talk with your dietitian about what dietary choices are best for  you. Grains Whole-grain or whole-wheat bread. Whole-grain or whole-wheat pasta. Brown rice. [place]. Bulgur. Whole-grain and low-sodium cereals. Pita bread. Low-fat, low-sodium crackers. Whole-wheat flour tortillas. Vegetables Fresh or frozen vegetables (raw, steamed, roasted, or grilled). Low-sodium or reduced-sodium tomato and vegetable juice. Low-sodium or reduced-sodium tomato sauce and tomato paste. Low-sodium or reduced-sodium canned vegetables. Fruits All fresh, dried, or frozen fruit. Canned fruit in natural juice (without added sugar). Meat and other protein foods Skinless chicken or [place]. Ground chicken or [place]. Pork with fat trimmed off. Fish and seafood. Egg whites. Dried beans, peas, or lentils. Unsalted nuts, nut butters, and seeds. Unsalted canned beans. Lean cuts of beef with fat trimmed off. Low-sodium, lean deli meat. Dairy Low-fat (1%) or fat-free (skim) milk. Fat-free, low-fat, or reduced-fat cheeses. Nonfat, low-sodium ricotta or cottage cheese. Low-fat or nonfat yogurt. Low-fat, low-sodium cheese. Fats and oils Soft margarine without trans fats. Vegetable oil. Low-fat, reduced-fat, or light mayonnaise and salad dressings (reduced-sodium). Canola, safflower, olive, soybean, and sunflower oils. Avocado. Seasoning and other foods Herbs. Spices. Seasoning mixes without salt. Unsalted popcorn and pretzels. Fat-free sweets. What foods are not recommended? The items listed may not be a complete list. Talk with your dietitian about what dietary choices are best for you. Grains Baked goods made with fat, such as croissants, muffins, or some breads. Dry pasta or rice meal packs. Vegetables Creamed or fried vegetables. Vegetables in a cheese sauce. Regular canned vegetables (not low-sodium or reduced-sodium). Regular canned tomato sauce and paste (not low-sodium or reduced-sodium). Regular tomato and vegetable juice (not low-sodium or reduced-sodium). Angie Fava.  Olives. Fruits Canned fruit in a light or heavy syrup. Fried fruit. Fruit in cream or butter sauce. Meat and other protein foods Fatty cuts of meat. Ribs. Fried meat. Berniece Salines. Sausage. Bologna and other processed lunch meats. Salami. Fatback. Hotdogs. Bratwurst. Salted nuts and seeds. Canned beans with added salt. Canned or smoked fish. Whole eggs or egg yolks. Chicken or [place] with skin. Dairy Whole or 2% milk, cream, and half-and-half. Whole or full-fat cream cheese. Whole-fat or sweetened yogurt. Full-fat cheese. Nondairy creamers. Whipped toppings. Processed cheese and cheese spreads. Fats and oils Butter. Stick margarine. Lard. Shortening. Ghee. Bacon fat. Tropical oils, such as coconut, palm kernel, or palm oil. Seasoning and other foods Salted popcorn and pretzels. Onion salt, garlic salt, seasoned salt, table salt, and sea salt. Worcestershire sauce. Tartar sauce. Barbecue sauce. Teriyaki sauce. Soy sauce, including reduced-sodium. Steak sauce. Canned and packaged gravies. Fish sauce. Oyster sauce. Cocktail sauce. Horseradish that you find on the shelf. Ketchup. Mustard. Meat flavorings and tenderizers. Bouillon cubes. Hot sauce and Tabasco sauce. Premade or packaged marinades. Premade or packaged taco seasonings. Relishes. Regular salad dressings. Where to find more information:  National Heart, Lung, and [place]: https://wilson-eaton.com/  American Heart Association: www.heart.org Summary  The DASH eating plan is a healthy eating plan that has been  shown to reduce high blood pressure (hypertension). It may also reduce your risk for type 2 diabetes, heart disease, and stroke.  With the DASH eating plan, you should limit salt (sodium) intake to 2,300 mg a day. If you have hypertension, you may need to reduce your sodium intake to 1,500 mg a day.  When on the DASH eating plan, aim to eat more fresh fruits and vegetables, whole grains, lean proteins, low-fat dairy, and heart-healthy  fats.  Work with your health care provider or diet and nutrition specialist (dietitian) to adjust your eating plan to your individual calorie needs. This information is not intended to replace advice given to you by your health care provider. Make sure you discuss any questions you have with your health care provider. Document Revised: 08/04/2017 Document Reviewed: 08/15/2016 Elsevier Patient Education  2020 [employer].

## 2020-03-24 DIAGNOSIS — F419 Anxiety disorder, unspecified: Secondary | ICD-10-CM | POA: Diagnosis not present

## 2020-03-24 DIAGNOSIS — F902 Attention-deficit hyperactivity disorder, combined type: Secondary | ICD-10-CM | POA: Diagnosis not present

## 2020-03-24 DIAGNOSIS — Z79899 Other long term (current) drug therapy: Secondary | ICD-10-CM | POA: Diagnosis not present
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
## 2020-04-27 NOTE — Patient Instructions (Addendum)
Health Maintenance Due  Topic Date Due  . Hepatitis C Screening will get at next labs  Never done  . INFLUENZA VACCINE will get later in the year  04/05/2020   With home blood pressures running still over 140 and also with recent increase in anxiety as well as improvement with labetalol-I would like to try propranolol 40 mg twice daily on a regular basis to see if this helps with blood pressure as well as anxiety.  We considered restarting Wellbutrin but he did not feel like he got a significant improvement on that in the past.  If propranolol is not effective could also try an as needed medicine such as buspirone or hydroxyzine.  Also if propranolol or as needed medicines do not work could consider a traditional antianxiety/depression medicine like Lexapro or Zoloft or Prozac or celexa.

## 2020-04-28 ENCOUNTER — Other Ambulatory Visit: Payer: Self-pay

## 2020-04-28 ENCOUNTER — Encounter: Payer: Self-pay | Admitting: Family Medicine

## 2020-04-28 ENCOUNTER — Ambulatory Visit (INDEPENDENT_AMBULATORY_CARE_PROVIDER_SITE_OTHER): Payer: BC Managed Care – PPO | Admitting: Family Medicine

## 2020-04-28 VITALS — BP 124/62 | HR 78 | Temp 98.6°F | Ht 75.0 in | Wt 208.0 lb

## 2020-04-28 DIAGNOSIS — F419 Anxiety disorder, unspecified: Secondary | ICD-10-CM

## 2020-04-28 DIAGNOSIS — I10 Essential (primary) hypertension: Secondary | ICD-10-CM | POA: Diagnosis not present

## 2020-04-28 DIAGNOSIS — F439 Reaction to severe stress, unspecified: Secondary | ICD-10-CM | POA: Diagnosis not present

## 2020-04-28 MED ORDER — PROPRANOLOL HCL 40 MG PO TABS: 40.0000 mg | ORAL_TABLET | Freq: Two times a day (BID) | ORAL | 5 refills | Status: DC

## 2020-04-28 NOTE — Assessment & Plan Note (Signed)
#  hypertension. New diagnosis 04/28/20  S: medication: none Home readings #s: consistently above 140 at home BP Readings from Last 3 Encounters:  04/28/20 124/62  02/20/20 (!) 142/88  02/18/20 134/84  A/P: poor control at home- would like to bring this below 140/90 consistently- will trial propranolol 40mg  twice daily as also may help with anxiety

## 2020-06-18 ENCOUNTER — Emergency Department (HOSPITAL_COMMUNITY)
Admission: EM | Admit: 2020-06-18 | Discharge: 2020-06-18 | Disposition: A | Discharge status: Home / Self Care | Payer: BC Managed Care – PPO | Attending: Emergency Medicine | Admitting: Emergency Medicine

## 2020-06-18 ENCOUNTER — Emergency Department (HOSPITAL_COMMUNITY): Payer: BC Managed Care – PPO

## 2020-06-18 ENCOUNTER — Encounter: Payer: Self-pay | Admitting: Family Medicine

## 2020-06-18 ENCOUNTER — Telehealth: Payer: Self-pay

## 2020-06-18 ENCOUNTER — Encounter (HOSPITAL_COMMUNITY): Payer: Self-pay

## 2020-06-18 DIAGNOSIS — I10 Essential (primary) hypertension: Secondary | ICD-10-CM | POA: Insufficient documentation

## 2020-06-18 DIAGNOSIS — Z79899 Other long term (current) drug therapy: Secondary | ICD-10-CM | POA: Diagnosis not present

## 2020-06-18 DIAGNOSIS — R202 Paresthesia of skin: Secondary | ICD-10-CM | POA: Insufficient documentation

## 2020-06-18 DIAGNOSIS — R9082 White matter disease, unspecified: Secondary | ICD-10-CM | POA: Diagnosis not present

## 2020-06-18 DIAGNOSIS — I639 Cerebral infarction, unspecified: Secondary | ICD-10-CM | POA: Diagnosis not present

## 2020-06-18 DIAGNOSIS — R200 Anesthesia of skin: Secondary | ICD-10-CM | POA: Diagnosis not present

## 2020-06-18 DIAGNOSIS — R29818 Other symptoms and signs involving the nervous system: Secondary | ICD-10-CM | POA: Diagnosis not present

## 2020-06-18 DIAGNOSIS — Z87891 Personal history of nicotine dependence: Secondary | ICD-10-CM | POA: Diagnosis not present

## 2020-06-18 LAB — DIFFERENTIAL
Abs Immature Granulocytes: 0.02 10*3/uL (ref 0.00–0.07)
Basophils Absolute: 0 10*3/uL (ref 0.0–0.1)
Basophils Relative: 1 %
Eosinophils Absolute: 0.2 10*3/uL (ref 0.0–0.5)
Eosinophils Relative: 2 %
Immature Granulocytes: 0 %
Lymphocytes Relative: 23 %
Lymphs Abs: 1.8 10*3/uL (ref 0.7–4.0)
Monocytes Absolute: 0.6 10*3/uL (ref 0.1–1.0)
Monocytes Relative: 8 %
Neutro Abs: 5.5 10*3/uL (ref 1.7–7.7)
Neutrophils Relative %: 66 %

## 2020-06-18 LAB — COMPREHENSIVE METABOLIC PANEL
ALT: 32 U/L (ref 0–44)
AST: 22 U/L (ref 15–41)
Albumin: 4.2 g/dL (ref 3.5–5.0)
Alkaline Phosphatase: 61 U/L (ref 38–126)
Anion gap: 10 (ref 5–15)
BUN: 13 mg/dL (ref 6–20)
CO2: 25 mmol/L (ref 22–32)
Calcium: 9.3 mg/dL (ref 8.9–10.3)
Chloride: 104 mmol/L (ref 98–111)
Creatinine, Ser: 1.04 mg/dL (ref 0.61–1.24)
GFR, Estimated: >60 mL/min (ref >60)
Glucose, Bld: 102 mg/dL — ABNORMAL HIGH (ref 70–99)
Potassium: 3.8 mmol/L (ref 3.5–5.1)
Sodium: 139 mmol/L (ref 135–145)
Total Bilirubin: 1 mg/dL (ref 0.3–1.2)
Total Protein: 7.5 g/dL (ref 6.5–8.1)

## 2020-06-18 LAB — TROPONIN I (HIGH SENSITIVITY)
Troponin I (High Sensitivity): 3 ng/L (ref <18)
Troponin I (High Sensitivity): <2 ng/L (ref <18)

## 2020-06-18 LAB — CBC
HCT: 48 % (ref 39.0–52.0)
Hemoglobin: 15.6 g/dL (ref 13.0–17.0)
MCH: 29.7 pg (ref 26.0–34.0)
MCHC: 32.5 g/dL (ref 30.0–36.0)
MCV: 91.3 fL (ref 80.0–100.0)
Platelets: 277 10*3/uL (ref 150–400)
RBC: 5.26 MIL/uL (ref 4.22–5.81)
RDW: 12.2 % (ref 11.5–15.5)
WBC: 8.2 10*3/uL (ref 4.0–10.5)
nRBC: 0 % (ref 0.0–0.2)

## 2020-06-18 LAB — CBG MONITORING, ED: Glucose-Capillary: 90 mg/dL (ref 70–99)

## 2020-06-18 LAB — PROTIME-INR
INR: 1 (ref 0.8–1.2)
Prothrombin Time: 12.5 seconds (ref 11.4–15.2)

## 2020-06-18 LAB — APTT: aPTT: 27 seconds (ref 24–36)
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
## 2020-06-18 NOTE — Telephone Encounter (Signed)
Agree with disposition-glad patient was evaluated.  Reassuring MRI from ER notes

## 2020-06-18 NOTE — ED Triage Notes (Signed)
Pt reports right sided facial numbness that started this morning around 930 while working.  Pt also reports left arm numbness a few days ago. Denies any chest pain or SOB. No weakness, slurred speech or facial droop noted.

## 2020-06-18 NOTE — Discharge Instructions (Addendum)
Your work-up today was reassuring including your MRI.  Schedule follow-up for neurology, they should call you within the week.  However they do not you can call to schedule, the information is attached.  If you start having any new or worsening concerning symptoms please come back to the emergency department.  I hope you feel better!
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
## 2020-06-18 NOTE — Telephone Encounter (Signed)
FYI, pt did go to ER. 

## 2020-06-19 MED ORDER — ESCITALOPRAM OXALATE 10 MG PO TABS: 10.0000 mg | ORAL_TABLET | Freq: Every day | ORAL | 5 refills | Status: DC

## 2020-06-21 ENCOUNTER — Other Ambulatory Visit: Payer: Self-pay

## 2020-06-21 MED ORDER — ESCITALOPRAM OXALATE 10 MG PO TABS
10.0000 mg | ORAL_TABLET | Freq: Every day | ORAL | 5 refills | Status: DC
Start: 2020-06-21 — End: 2020-06-30

## 2020-06-24 ENCOUNTER — Ambulatory Visit (INDEPENDENT_AMBULATORY_CARE_PROVIDER_SITE_OTHER): Payer: BC Managed Care – PPO | Admitting: Neurology

## 2020-06-24 ENCOUNTER — Other Ambulatory Visit: Payer: Self-pay

## 2020-06-24 ENCOUNTER — Encounter: Payer: Self-pay | Admitting: Neurology

## 2020-06-24 VITALS — BP 136/85 | HR 63 | Ht 75.0 in | Wt 200.4 lb

## 2020-06-24 DIAGNOSIS — F411 Generalized anxiety disorder: Secondary | ICD-10-CM | POA: Diagnosis not present

## 2020-06-24 DIAGNOSIS — R202 Paresthesia of skin: Secondary | ICD-10-CM | POA: Diagnosis not present

## 2020-06-24 DIAGNOSIS — R200 Anesthesia of skin: Secondary | ICD-10-CM

## 2020-06-24 NOTE — Patient Instructions (Signed)
I had a long discussion with the patient regarding his episode of transient right face numbness occurring in the setting of significant anxiety.  We discussed results of brain MRI scan and lab work all of which were unremarkable.  I encouraged him to increase participation in regular activities for stress relaxation like meditation and yoga.  Continue Celexa which has been started by his primary care physician as well.  No further neurological work-up is necessary at the present time.  He may return for follow-up in the future only if necessary.  No routine schedule appointment was made.  Managing Anxiety, Adult After being diagnosed with an anxiety disorder, you may be relieved to know why you have felt or behaved a certain way. You may also feel overwhelmed about the treatment ahead and what it will mean for your life. With care and support, you can manage this condition and recover from it. How to manage lifestyle changes Managing stress and anxiety  Stress is your body's reaction to life changes and events, both good and bad. Most stress will last just a few hours, but stress can be ongoing and can lead to more than just stress. Although stress can play a major role in anxiety, it is not the same as anxiety. Stress is usually caused by something external, such as a deadline, test, or competition. Stress normally passes after the triggering event has ended.  Anxiety is caused by something internal, such as imagining a terrible outcome or worrying that something will go wrong that will devastate you. Anxiety often does not go away even after the triggering event is over, and it can become long-term (chronic) worry. It is important to understand the differences between stress and anxiety and to manage your stress effectively so that it does not lead to an anxious response. Talk with your health care provider or a counselor to learn more about reducing anxiety and stress. He or she may suggest tension  reduction techniques, such as:  Music therapy. This can include creating or listening to music that you enjoy and that inspires you.  Mindfulness-based meditation. This involves being aware of your normal breaths while not trying to control your breathing. It can be done while sitting or walking.  Centering prayer. This involves focusing on a word, phrase, or sacred image that means something to you and brings you peace.  Deep breathing. To do this, expand your stomach and inhale slowly through your nose. Hold your breath for 3-5 seconds. Then exhale slowly, letting your stomach muscles relax.  Self-talk. This involves identifying thought patterns that lead to anxiety reactions and changing those patterns.  Muscle relaxation. This involves tensing muscles and then relaxing them. Choose a tension reduction technique that suits your lifestyle and personality. These techniques take time and practice. Set aside 5-15 minutes a day to do them. Therapists can offer counseling and training in these techniques. The training to help with anxiety may be covered by some insurance plans. Other things you can do to manage stress and anxiety include:  Keeping a stress/anxiety diary. This can help you learn what triggers your reaction and then learn ways to manage your response.  Thinking about how you react to certain situations. You may not be able to control everything, but you can control your response.  Making time for activities that help you relax and not feeling guilty about spending your time in this way.  Visual imagery and yoga can help you stay calm and relax.  Medicines Medicines  can help ease symptoms. Medicines for anxiety include:  Anti-anxiety drugs.  Antidepressants. Medicines are often used as a primary treatment for anxiety disorder. Medicines will be prescribed by a health care provider. When used together, medicines, psychotherapy, and tension reduction techniques may be the most  effective treatment. Relationships Relationships can play a big part in helping you recover. Try to spend more time connecting with trusted friends and family members. Consider going to couples counseling, taking family education classes, or going to family therapy. Therapy can help you and others better understand your condition. How to recognize changes in your anxiety Everyone responds differently to treatment for anxiety. Recovery from anxiety happens when symptoms decrease and stop interfering with your daily activities at home or work. This may mean that you will start to:  Have better concentration and focus. Worry will interfere less in your daily thinking.  Sleep better.  Be less irritable.  Have more energy.  Have improved memory. It is important to recognize when your condition is getting worse. Contact your health care provider if your symptoms interfere with home or work and you feel like your condition is not improving. Follow these instructions at home: Activity  Exercise. Most adults should do the following: ? Exercise for at least 150 minutes each week. The exercise should increase your heart rate and make you sweat (moderate-intensity exercise). ? Strengthening exercises at least twice a week.  Get the right amount and quality of sleep. Most adults need 7-9 hours of sleep each night. Lifestyle   Eat a healthy diet that includes plenty of vegetables, fruits, whole grains, low-fat dairy products, and lean protein. Do not eat a lot of foods that are high in solid fats, added sugars, or salt.  Make choices that simplify your life.  Do not use any products that contain nicotine or tobacco, such as cigarettes, e-cigarettes, and chewing tobacco. If you need help quitting, ask your health care provider.  Avoid caffeine, alcohol, and certain over-the-counter cold medicines. These may make you feel worse. Ask your pharmacist which medicines to avoid. General  instructions  Take over-the-counter and prescription medicines only as told by your health care provider.  Keep all follow-up visits as told by your health care provider. This is important. Where to find support You can get help and support from these sources:  Self-help groups.  Online and [employer].  A trusted spiritual leader.  Couples counseling.  Family education classes.  Family therapy. Where to find more information You may find that joining a support group helps you deal with your anxiety. The following sources can help you locate counselors or support groups near you:  Mental Health America: www.mentalhealthamerica.net  Anxiety and Depression Association of [place] (ADAA): ProgramCam.de  [employer] on Mental Illness (NAMI): www.nami.org Contact a health care provider if you:  Have a hard time staying focused or finishing daily tasks.  Spend many hours a day feeling worried about everyday life.  Become exhausted by worry.  Start to have headaches, feel tense, or have nausea.  Urinate more than normal.  Have diarrhea. Get help right away if you have:  A racing heart and shortness of breath.  Thoughts of hurting yourself or others. If you ever feel like you may hurt yourself or others, or have thoughts about taking your own life, get help right away. You can go to your nearest emergency department or call:  Your local emergency services (911 in the [place]  A suicide crisis helpline, such as  the National Suicide Prevention Lifeline at 409-113-1795. This is open 24 hours a day. Summary  Taking steps to learn and use tension reduction techniques can help calm you and help prevent triggering an anxiety reaction.  When used together, medicines, psychotherapy, and tension reduction techniques may be the most effective treatment.  Family, friends, and partners can play a big part in helping you recover from an anxiety disorder. This  information is not intended to replace advice given to you by your health care provider. Make sure you discuss any questions you have with your health care provider. Document Revised: 01/22/2019 Document Reviewed: 01/22/2019 Elsevier Patient Education  2020 [employer].

## 2020-06-24 NOTE — Progress Notes (Signed)
[facility] [address] [place]. [place] [address] [phone]       OFFICE CONSULT NOTE  Mr. Robert Ashley Date of Birth:  06/05/1981 Medical Record Number:  [id number]   Referring MD: Harvel Ricks PA-C  Reason for Referral: Numbness  HPI: Mr. Robert Ashley is a 39 year old Caucasian male with no significant past medical history seen today for office consultation visit for facial numbness.  History is obtained from the patient, review of electronic medical records and personally reviewed available imaging films in PACS.  Patient states that she has had a longstanding history of anxiety and panic-like episodes.  Recently the last 5 6 months she has been through a lot of stress following the illness of his wife during the degree of their 29-month-old baby when she developed pulmonary embolism.  Is been working from home and looking after his children and his wife.  He was seen in the ER on 06/18/2020 started with some chest tightness and a crawling sensation followed by left arm numbness and then subsequently right cheek area he had tingling and numbness and it felt little hot.  He came to the ER by the time his symptoms were improving.  Lab work in the ER included CBC and BMP which were unremarkable.  CT scan of the head was obtained which showed no acute abnormality subsequently MRI scan of the brain was also obtained without contrast and was normal.  Patient states since then he has discontinued extended release amphetamine which he was taking for ADHD which may have helped also is cut back drinking coffee.  His primary physician started him on Lexapro 3 days ago for his anxiety.  Patient admits to significant anxiety and stress and has not been participating in any regular activities for stress relaxation.  Denies any prior history of migraines, seizures, TIAs, stroke.  There is no family history of strokes or TIAs at a young age.  He was seen in the ER on 02/18/2020 for concerns  for elevated blood pressure readings at home and no warmth sensation in the left arm.  EKG showed no acute abnormalities cardiac enzymes are negative.  ROS:   14 system review of systems is positive for numbness, tingling, anxiety, chest discomfort, panic all other systems negative  PMH:  Past Medical History:  Diagnosis Date  . Chicken pox   . Elevated blood pressure reading    intermittent elevations when checking at pharmacy  . GERD (gastroesophageal reflux disease)    tums prn    Social History:  Social History   Socioeconomic History  . Marital status: Married    Spouse name: Robert Ashley  . Number of children: 2  . Years of education: Not on file  . Highest education level: Not on file  Occupational History  . Occupation: Full time2  Tobacco Use  . Smoking status: Former Smoker    Packs/day: 1.00    Years: 6.00    Pack years: 6.00    Quit date: 09/05/2010    Years since quitting: 9.8  . Smokeless tobacco: Never Used  Substance and Sexual Activity  . Alcohol use: Yes    Comment: Social 2-3  . Drug use: No  . Sexual activity: Yes  Other Topics Concern  . 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       Lives with wife and 2 children  Right handed   Drinks no caffeine/ was drinking 1 high caffeine starbucks daily    Social Determinants of Health   Financial Resource Strain:   . Difficulty of Paying Living Expenses: Not on file  Food Insecurity:   . Worried About Programme researcher, broadcasting/film/video in the Last Year: Not on file  . Ran Out of Food in the Last Year: Not on file  Transportation Needs:   . Lack of Transportation (Medical): Not on file  . Lack of Transportation (Non-Medical): Not on file  Physical Activity:   . Days of Exercise per Week: Not on file  . Minutes of Exercise per Session: Not on file  Stress:   . Feeling of Stress : Not on file    Social Connections:   . Frequency of Communication with Friends and Family: Not on file  . Frequency of Social Gatherings with Friends and Family: Not on file  . Attends Religious Services: Not on file  . Active Member of Clubs or Organizations: Not on file  . Attends Banker Meetings: Not on file  . Marital Status: Not on file  Intimate Partner Violence:   . Fear of Current or Ex-Partner: Not on file  . Emotionally Abused: Not on file  . Physically Abused: Not on file  . Sexually Abused: Not on file    Medications:   Current Outpatient Medications on File Prior to Visit  Medication Sig Dispense Refill  . escitalopram (LEXAPRO) 10 MG tablet Take 1 tablet (10 mg total) by mouth daily. 30 tablet 5  . propranolol (INDERAL) 40 MG tablet Take 1 tablet (40 mg total) by mouth 2 (two) times daily. 60 tablet 5  . Amphetamine ER (ADZENYS XR-ODT) 18.8 MG TBED Take 18.8 mg by mouth in the morning and at bedtime.      No current facility-administered medications on file prior to visit.    Allergies:  No Known Allergies  Physical Exam General: well developed, well nourished middle-aged Caucasian male, seated, in no evident distress Head: head normocephalic and atraumatic.   Neck: supple with no carotid or supraclavicular bruits Cardiovascular: regular rate and rhythm, no murmurs Musculoskeletal: no deformity Skin:  no rash/petichiae Vascular:  Normal pulses all extremities  Neurologic Exam Mental Status: Awake and fully alert. Oriented to place and time. Recent and remote memory intact. Attention span, concentration and fund of knowledge appropriate. Mood and affect appropriate.  Cranial Nerves: Fundoscopic exam reveals sharp disc margins. Pupils equal, briskly reactive to light. Extraocular movements full without nystagmus. Visual fields full to confrontation. Hearing intact. Facial sensation intact. Face, tongue, palate moves normally and symmetrically.  Motor: Normal bulk  and tone. Normal strength in all tested extremity muscles. Sensory.: intact to touch , pinprick , position and vibratory sensation.  Coordination: Rapid alternating movements normal in all extremities. Finger-to-nose and heel-to-shin performed accurately bilaterally. Gait and Station: Arises from chair without difficulty. Stance is normal. Gait demonstrates normal stride length and balance . Able to heel, toe and tandem walk without difficulty.  Reflexes: 1+ and symmetric. Toes downgoing.       ASSESSMENT: 39 year old Caucasian male with transient episode of right face numbness in the setting of anxiety.  Unremarkable neurological exam as well as brain imaging study.     PLAN: I had a long discussion with the patient regarding his episode of transient right face numbness occurring in the setting of significant anxiety.  We discussed results of brain MRI scan and lab work all of which  were unremarkable.  I encouraged him to increase participation in regular activities for stress relaxation like meditation and yoga.  Continue Celexa which has been started by his primary care physician as well.  No further neurological work-up is necessary at the present time.  He may return for follow-up in the future only if necessary.  No routine schedule appointment was made. Delia Heady, MD Note: This document was prepared with digital dictation and possible smart phrase technology. Any transcriptional errors that result from this process are unintentional.

## 2020-06-29 NOTE — Patient Instructions (Addendum)
Please stop by lab before you go If you have mychart- we will send your results within 3 business days of [place] receiving them.  If you do not have mychart- we will call you about results within 5 business days of [place] receiving them.  *please note we are currently using [facility] which has a longer processing time than [place] typically so labs may not come back as quickly as in the past *please also note that you will see labs on mychart as soon as they post. I will later go in and write notes on them- will say "notes from Dr. Durene Cal"  Health Maintenance Due  Topic Date Due  . INFLUENZA VACCINE In office flu shot today 04/05/2020   Stop propranolol- start labetalol   Continue lexapro 10mg . See me in 6 weeks. For acute flares of anxiety try hydroxyzine- start with half tablet and can use whole if not effective as long as doesn't make you too sleep

## 2020-06-29 NOTE — Progress Notes (Signed)
Phone: [phone]    Subjective:  Patient presents today for their annual physical. Chief complaint-noted.   See problem oriented charting- ROS- full  review of systems was completed and negative  except for: light headed at times with episode that led to ED as well as chest tightness, right facial numbness improving, sweating at times  The following were reviewed and entered/updated in epic: Past Medical History:  Diagnosis Date  . Chicken pox   . Elevated blood pressure reading    intermittent elevations when checking at pharmacy  . GERD (gastroesophageal reflux disease)    tums prn   Patient Active Problem List   Diagnosis Date Noted  . Essential hypertension     Priority: Medium  . ADHD (attention deficit hyperactivity disorder), combined type 12/19/2018  . Anxiety 08/24/2018  . GERD (gastroesophageal reflux disease)    Past Surgical History:  Procedure Laterality Date  . none      Family History  Problem Relation Age of Onset  . Healthy Mother   . Hyperlipidemia Father   . Hypertension Father   . Lung cancer Maternal Grandfather        smoker  . Stroke Paternal Grandmother        led to death mid 26s  . Hypertension Paternal Grandmother   . Dementia Maternal Grandmother   . Glaucoma Paternal Grandfather     Medications- reviewed and updated Current Outpatient Medications  Medication Sig Dispense Refill  . escitalopram (LEXAPRO) 10 MG tablet Take 1 tablet (10 mg total) by mouth daily. 30 tablet 5  . labetalol (NORMODYNE) 100 MG tablet Take 1 tablet (100 mg total) by mouth 2 (two) times daily. 180 tablet 3  . hydrOXYzine (ATARAX/VISTARIL) 25 MG tablet Take 0.5-1 tablets (12.5-25 mg total) by mouth every 8 (eight) hours as needed for anxiety (or insomnia. dont drive for 6 hours after taking). 30 tablet 2   No current facility-administered medications for this visit.    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       Lives with wife and 2 children   Right handed   Drinks no caffeine/ was drinking 1 high caffeine starbucks daily       Objective:  BP 120/78   Pulse 72   Temp 99.1 F (37.3 C) (Temporal)   Resp 18   Ht 6\' 3"  (1.905 m)   Wt 200 lb 6.4 oz (90.9 kg)   SpO2 97%   BMI 25.05 kg/m  Gen: NAD, resting comfortably HEENT: Mucous membranes are moist. Oropharynx normal Neck: no thyromegaly CV: RRR no murmurs rubs or gallops Lungs: CTAB no crackles, wheeze, rhonchi Abdomen: soft/nontender/nondistended/normal bowel sounds. No rebound or guarding.  Ext: no edema Skin: warm, dry Neuro: grossly normal, moves all extremities, PERRLA     Assessment and Plan:  39 y.o. male presenting for annual physical.  Health Maintenance counseling: 1. Anticipatory guidance: Patient counseled regarding regular dental exams q6 months, eye exams- no isseus with vision,  avoiding smoking and second hand smoke, limiting alcohol to 2 beverages per day- 3 per week perhaps.   2. Risk factor reduction:  Advised patient of need for regular exercise and diet rich and fruits and vegetables to reduce risk of heart attack and stroke. Exercise- trying to do some walking for anxiety relief. Diet- diet has not been  the best but weight ok. States overid fried food and red meat in the past- trying to improve lately with more fruits/veggies/less red meat Wt Readings from Last 3 Encounters:  06/30/20 200 lb 6.4 oz (90.9 kg)  06/24/20 200 lb 6.4 oz (90.9 kg)  06/18/20 210 lb (95.3 kg)  3. Immunizations/screenings/ancillary studies- flu shot today . ops in HV screen Immunization History  Administered Date(s) Administered  . Influenza,inj,Quad PF,6+ Mos 06/23/2019  . Influenza-Unspecified 06/07/2016, 06/06/2018  . PFIZER SARS-COV-2 Vaccination 10/25/2019, 11/18/2019  . Tdap 03/28/2016   4.  Prostate cancer screening- no family history, start at age 68  5. Colon cancer screening - no family history, start at age 64 6. Skin cancer screening/prevention- no dermatologist. advised regular sunscreen use. Denies worrisome, changing, or new skin lesions.  7. Testicular cancer screening- advised monthly self exams  8. STD screening- patient opts out as monogomous 9. Former smoker- quit in 2012- just 6 pack years- no regular screening required  Status of chronic or acute concerns   #hyperlipidemia S: Medication: None Lab Results  Component Value Date   CHOL 233 (H) 06/28/2016   HDL 70.00 06/28/2016   LDLCALC 150 (H) 06/28/2016   TRIG 66.0 06/28/2016   CHOLHDL 3 06/28/2016   A/P: no recent checks- update lipids today- unlikely to consider statin ulness LDL above 190    Recommended follow up:  6 week follow up  Lab/Order associations:Non fasting   ICD-10-CM   1. Preventative health care  Z00.00   5. Encounter for hepatitis C screening test for low risk patient  Z11.59 Hepatitis C antibody   Return precautions advised.   Tana Conch, MD

## 2020-06-30 ENCOUNTER — Other Ambulatory Visit: Payer: Self-pay | Admitting: Family Medicine

## 2020-06-30 ENCOUNTER — Other Ambulatory Visit: Payer: Self-pay

## 2020-06-30 ENCOUNTER — Encounter: Payer: Self-pay | Admitting: Family Medicine

## 2020-06-30 ENCOUNTER — Ambulatory Visit (INDEPENDENT_AMBULATORY_CARE_PROVIDER_SITE_OTHER): Payer: BC Managed Care – PPO | Admitting: Family Medicine

## 2020-06-30 VITALS — BP 120/78 | HR 72 | Temp 99.1°F | Resp 18 | Ht 75.0 in | Wt 200.4 lb

## 2020-06-30 DIAGNOSIS — F419 Anxiety disorder, unspecified: Secondary | ICD-10-CM | POA: Diagnosis not present

## 2020-06-30 DIAGNOSIS — R202 Paresthesia of skin: Secondary | ICD-10-CM

## 2020-06-30 DIAGNOSIS — Z0000 Encounter for general adult medical examination without abnormal findings: Secondary | ICD-10-CM

## 2020-06-30 DIAGNOSIS — Z1159 Encounter for screening for other viral diseases: Secondary | ICD-10-CM

## 2020-06-30 DIAGNOSIS — I10 Essential (primary) hypertension: Secondary | ICD-10-CM

## 2020-06-30 DIAGNOSIS — Z23 Encounter for immunization: Secondary | ICD-10-CM

## 2020-06-30 DIAGNOSIS — K219 Gastro-esophageal reflux disease without esophagitis: Secondary | ICD-10-CM

## 2020-06-30 MED ORDER — LABETALOL HCL 100 MG PO TABS
100.0000 mg | ORAL_TABLET | Freq: Two times a day (BID) | ORAL | 3 refills | Status: DC
Start: 2020-06-30 — End: 2020-09-07

## 2020-06-30 MED ORDER — ESCITALOPRAM OXALATE 10 MG PO TABS
10.0000 mg | ORAL_TABLET | Freq: Every day | ORAL | 5 refills | Status: DC
Start: 2020-06-30 — End: 2021-01-20

## 2020-06-30 MED ORDER — HYDROXYZINE HCL 25 MG PO TABS: 12.5000 mg | ORAL_TABLET | Freq: Three times a day (TID) | ORAL | 2 refills | Status: DC | PRN

## 2020-06-30 NOTE — Progress Notes (Addendum)
Phone [phone] In person visit   Subjective:   Robert Ashley is a 39 y.o. year old very pleasant male patient who presents for/with See problem oriented charting  This visit occurred during the SARS-CoV-2 public health emergency.  Safety protocols were in place, including screening questions prior to the visit, additional usage of staff PPE, and extensive cleaning of exam room while observing appropriate contact time as indicated for disinfecting solutions.   Past Medical History-  Patient Active Problem List   Diagnosis Date Noted  . Essential hypertension     Priority: Medium  . ADHD (attention deficit hyperactivity disorder), combined type 12/19/2018  . Anxiety 08/24/2018  . GERD (gastroesophageal reflux disease)     Medications- reviewed and updated Current Outpatient Medications  Medication Sig Dispense Refill  . escitalopram (LEXAPRO) 10 MG tablet Take 1 tablet (10 mg total) by mouth daily. 30 tablet 5  . labetalol (NORMODYNE) 100 MG tablet Take 1 tablet (100 mg total) by mouth 2 (two) times daily. 180 tablet 3  . hydrOXYzine (ATARAX/VISTARIL) 25 MG tablet Take 0.5-1 tablets (12.5-25 mg total) by mouth every 8 (eight) hours as needed for anxiety (or insomnia. dont drive for 6 hours after taking). 30 tablet 2   No current facility-administered medications for this visit.     Objective:  BP 120/78   Pulse 72   Temp 99.1 F (37.3 C) (Temporal)   Resp 18   Ht 6\' 3"  (1.905 m)   Wt 200 lb 6.4 oz (90.9 kg)   SpO2 97%   BMI 25.05 kg/m  Gen: NAD, resting comfortably    Assessment and Plan   #hypertension-new diagnosis 04/28/2020 S: medication: Propranolol 40Mg  BID. He is actually taking labetalol 100mg  twice daily (was taking wife's and recommended against it) and feels having better results with this. HR resting now down into the 60s.  Home readings #s: checking regularly - mid 140s/mid 80s if high in AM and trends down in they day on meds- an hour after meds  130-135/70s or 80s  Feels some chills if feels like BP goes up and also gets some hand and feet sweating. Improving some with lexapro so may be anxiety driven. Doubt pheochromocytoma.  BP Readings from Last 3 Encounters:  06/30/20 120/78  06/24/20 136/85  06/18/20 124/83  A/P: blood pressure well controlled- switch over to labetalol formally with 100mg  twice daily sent in today  # Anxiety #right face numbness/tingling S:Medication: Lexapro 10Mg -initially started with propranolol 40 mg twice daily to see if this would help but needed additional support-see my chart messages  Had an episode of right facial tingling that led to ER 06/18/20- CT and MRI reassuring. Numbness/tingling continued and saw Dr. 06/26/20 with neurology on 06/24/2020- who thought this was likely anxiety related- reassuring neurological exam. Discussed meditation with him/deep breathing- he is using his watch to alert him to breath. Also had some far left chest pain, lightheaded sensation- troponin and EKG reassuring  Patient reached out to me about potentially starting SSRI- anxiety at first and starting to feel somewhat better (initial increase in anxiety) by day 5- starting to feel some benefit. Around day 8 today No results found for: VITAMINB12 No results found for: TSH  Counseling: discussed this today- wants to hold off for now  Trying to cut down on caffeine intake  Stressors: bought and sold house in last 2 weeks A/P: suspect anxiety as trigger for symptoms- continue lexapro and recommended 6 week follow up to reevaluate  For right face  tingling/numbness - will check tsh and b12 level though doubt these are cause. TSH also reasonable with anxiety portion of symptoms.   No si- will update [place] if occurs  Recommended follow up: 6 week follow up recommended  Lab/Order associations:   ICD-10-CM   2. Essential hypertension  I10 Lipid panel    TSH  3. Anxiety  F41.9   4. Paresthesias  R20.2 TSH   Meds ordered  this encounter  Medications  . escitalopram (LEXAPRO) 10 MG tablet    Sig: Take 1 tablet (10 mg total) by mouth daily.    Dispense:  30 tablet    Refill:  5  . labetalol (NORMODYNE) 100 MG tablet    Sig: Take 1 tablet (100 mg total) by mouth 2 (two) times daily.    Dispense:  180 tablet    Refill:  3  . hydrOXYzine (ATARAX/VISTARIL) 25 MG tablet    Sig: Take 0.5-1 tablets (12.5-25 mg total) by mouth every 8 (eight) hours as needed for anxiety (or insomnia. dont drive for 6 hours after taking).    Dispense:  30 tablet    Refill:  2   Return precautions advised.  Tana Conch, MD

## 2020-06-30 NOTE — Assessment & Plan Note (Signed)
#  hypertension-new diagnosis 04/28/2020 S: medication: Propranolol 40Mg  BID. He is actually taking labetalol 100mg  twice daily (was taking wife's and recommended against it) and feels having better results with this. HR resting now down into the 60s.  Home readings #s: checking regularly - mid 140s/mid 80s if high in AM and trends down in they day on meds- an hour after meds 130-135/70s or 80s  Feels some chills if feels like BP goes up and also gets some hand and feet sweating. Improving some with lexapro so may be anxiety driven. Doubt pheochromocytoma.  BP Readings from Last 3 Encounters:  06/30/20 120/78  06/24/20 136/85  06/18/20 124/83  A/P: blood pressure well controlled- switch over to labetalol formally with 100mg  twice daily sent in today

## 2020-06-30 NOTE — Assessment & Plan Note (Signed)
 #   Anxiety #right face numbness/tingling S:Medication: Lexapro 10Mg -initially started with propranolol 40 mg twice daily to see if this would help but needed additional support-see my chart messages  Had an episode of right facial tingling that led to ER 06/18/20- CT and MRI reassuring. Numbness/tingling continued and saw Dr. 06/20/20 with neurology on 06/24/2020- who thought this was likely anxiety related- reassuring neurological exam. Discussed meditation with him/deep breathing- he is using his watch to alert him to breath. Also had some far left chest pain, lightheaded sensation- troponin and EKG reassuring  Patient reached out to me about potentially starting SSRI- anxiety at first and starting to feel somewhat better (initial increase in anxiety) by day 5- starting to feel some benefit. Around day 8 today No results found for: VITAMINB12 No results found for: TSH  Counseling: discussed this today- wants to hold off for now  Trying to cut down on caffeine intake  Stressors: bought and sold house in last 2 weeks A/P: suspect anxiety as trigger for symptoms- continue lexapro and recommended 6 week follow up to reevaluate  For right face tingling/numbness - will check tsh and b12 level though doubt these are cause. TSH also reasonable with anxiety portion of symptoms.

## 2020-07-01 LAB — TSH: TSH: 1.1 mIU/L (ref 0.40–4.50)

## 2020-07-01 LAB — VITAMIN B12: Vitamin B-12: 406 pg/mL (ref 200–1100)

## 2020-07-01 LAB — HEPATITIS C ANTIBODY
Hepatitis C Ab: NONREACTIVE
SIGNAL TO CUT-OFF: 0.01 (ref <1.00)

## 2020-07-01 LAB — LIPID PANEL
Cholesterol: 196 mg/dL (ref <200)
HDL: 59 mg/dL (ref >=40)
LDL Cholesterol (Calc): 117 mg/dL (calc) — ABNORMAL HIGH
Non-HDL Cholesterol (Calc): 137 mg/dL (calc) — ABNORMAL HIGH (ref <130)
Total CHOL/HDL Ratio: 3.3 (calc) (ref <5.0)
Triglycerides: 102 mg/dL (ref <150)

## 2020-08-13 NOTE — Progress Notes (Signed)
Phone [phone] In person visit   Subjective:   Robert Ashley is a 39 y.o. year old very pleasant male patient who presents for/with See problem oriented charting Chief Complaint  Patient presents with  . Hypertension    Patient does monitor his BP home   . Anxiety    Pt states that his anxiety had gotten better he does has some concerns about some things he's noticed.    This visit occurred during the SARS-CoV-2 public health emergency.  Safety protocols were in place, including screening questions prior to the visit, additional usage of staff PPE, and extensive cleaning of exam room while observing appropriate contact time as indicated for disinfecting solutions.   Past Medical History-  Patient Active Problem List   Diagnosis Date Noted  . Essential hypertension     Priority: Medium  . ADHD (attention deficit hyperactivity disorder), combined type 12/19/2018  . GAD (generalized anxiety disorder) 08/24/2018  . GERD (gastroesophageal reflux disease)     Medications- reviewed and updated Current Outpatient Medications  Medication Sig Dispense Refill  . escitalopram (LEXAPRO) 10 MG tablet Take 1 tablet (10 mg total) by mouth daily. 30 tablet 5  . hydrOXYzine (ATARAX/VISTARIL) 25 MG tablet Take 0.5-1 tablets (12.5-25 mg total) by mouth every 8 (eight) hours as needed for anxiety (or insomnia. dont drive for 6 hours after taking). 30 tablet 2  . labetalol (NORMODYNE) 100 MG tablet Take 1 tablet (100 mg total) by mouth 2 (two) times daily. 180 tablet 3   No current facility-administered medications for this visit.     Objective:  BP 139/78   Pulse 73   Temp 98.7 F (37.1 C) (Temporal)   Ht 6\' 3"  (1.905 m)   Wt 209 lb 3.2 oz (94.9 kg)   SpO2 97%   BMI 26.15 kg/m  Gen: NAD, resting comfortably CV: RRR no murmurs rubs or gallops Lungs: CTAB no crackles, wheeze, rhonchi Ext: no edema    Assessment and Plan   # Anxiety/GAD S:Medication: lexapro 10 mg   Counseling: has declined  Some stress in last few days of moving.   Has gone back to coffee- but anxiety has not worsened GAD 7 : Generalized Anxiety Score 04/28/2020 08/23/2018  Nervous, Anxious, on Edge 1 2  Control/stop worrying 1 2  Worry too much - different things 1 2  Trouble relaxing 1 3  Restless 0 3  Easily annoyed or irritable 1 1  Afraid - awful might happen 1 0  Total GAD 7 Score 6 13  Anxiety Difficulty Somewhat difficult Very difficult   Depression screen [facility] 2/9 08/17/2020 04/28/2020 02/20/2020  Decreased Interest 0 0 0  Down, Depressed, Hopeless 0 0 0  PHQ - 2 Score 0 0 0  Altered sleeping 0 1 0  Tired, decreased energy 2 1 0  Change in appetite 0 1 0  Feeling bad or failure about yourself  0 0 0  Trouble concentrating 0 2 0  Moving slowly or fidgety/restless 3 0 0  Suicidal thoughts 0 0 0  PHQ-9 Score 5 5 0  Difficult doing work/chores Not difficult at all Not difficult at all Not difficult at all  A/P: reasonable control on lexapro 10 mg- continue current medication. PHQ9 and gad7 both under 5 today- team to update GAD score   # gets some skin pruning on fingertips- drinking at least 60 oz of water a day and somewhat better. Good capillary refill no skin tenting - I do not think he is  significantly dehydrated.   #hypertension S: medication: Labetalol 100 mg twice daily instead of propranolol. Slightly drowsy when he takes this. In morning when takes this with lexapro- gets slight lip numbness Home readings #s: averaging high 130s over high 70s BP Readings from Last 3 Encounters:  08/17/20 139/78  06/30/20 120/78  06/24/20 136/85  A/P: stable- with possible side effects opted not to change dose. Continue current medicine. Could add different medicine like amlodipine or valsartan if needed- for now focus on healthy lifestyle changes  Recommended follow up: Return in about 6 months (around 02/15/2021) for follow up- or sooner if needed.  Lab/Order  associations:   ICD-10-CM   1. GAD (generalized anxiety disorder)  F41.1   2. Essential hypertension  I10    Return precautions advised.  Tana Conch, MD

## 2020-08-13 NOTE — Patient Instructions (Addendum)
Glad you are doing better overall- lets continue current medicine  Recommended follow up: Return in about 5- 6 months (around 01/15/2021- 02/15/2021) for follow up- or sooner if needed. If you want can also go ahead and schedule your physical 1 year and 1 day from your lats one.

## 2020-08-17 ENCOUNTER — Encounter: Payer: Self-pay | Admitting: Family Medicine

## 2020-08-17 ENCOUNTER — Ambulatory Visit (INDEPENDENT_AMBULATORY_CARE_PROVIDER_SITE_OTHER): Payer: BC Managed Care – PPO | Admitting: Family Medicine

## 2020-08-17 ENCOUNTER — Other Ambulatory Visit: Payer: Self-pay

## 2020-08-17 DIAGNOSIS — I10 Essential (primary) hypertension: Secondary | ICD-10-CM | POA: Diagnosis not present

## 2020-08-17 DIAGNOSIS — F411 Generalized anxiety disorder: Secondary | ICD-10-CM

## 2020-08-17 NOTE — Assessment & Plan Note (Signed)
S: medication: Labetalol 100 mg twice daily instead of propranolol. Slightly drowsy when he takes this. In morning when takes this with lexapro- gets slight lip numbness Home readings #s: averaging high 130s over high 70s BP Readings from Last 3 Encounters:  08/17/20 139/78  06/30/20 120/78  06/24/20 136/85  A/P: stable- with possible side effects opted not to change dose. Continue current medicine. Could add different medicine like amlodipine or valsartan if needed- for now focus on healthy lifestyle changes

## 2020-08-17 NOTE — Assessment & Plan Note (Signed)
S:Medication: lexapro 10 mg  Counseling: has declined  Some stress in last few days of moving.   Has gone back to coffee- but anxiety has not worsened GAD 7 : Generalized Anxiety Score 04/28/2020 08/23/2018  Nervous, Anxious, on Edge 1 2  Control/stop worrying 1 2  Worry too much - different things 1 2  Trouble relaxing 1 3  Restless 0 3  Easily annoyed or irritable 1 1  Afraid - awful might happen 1 0  Total GAD 7 Score 6 13  Anxiety Difficulty Somewhat difficult Very difficult   Depression screen [facility] 2/9 08/17/2020 04/28/2020 02/20/2020  Decreased Interest 0 0 0  Down, Depressed, Hopeless 0 0 0  PHQ - 2 Score 0 0 0  Altered sleeping 0 1 0  Tired, decreased energy 2 1 0  Change in appetite 0 1 0  Feeling bad or failure about yourself  0 0 0  Trouble concentrating 0 2 0  Moving slowly or fidgety/restless 3 0 0  Suicidal thoughts 0 0 0  PHQ-9 Score 5 5 0  Difficult doing work/chores Not difficult at all Not difficult at all Not difficult at all  A/P: reasonable control on lexapro 10 mg- continue current medication. PHQ9 and gad7 both under 5 today- team to update GAD score

## 2020-08-31 ENCOUNTER — Encounter: Payer: Self-pay | Admitting: Family Medicine

## 2020-09-02 ENCOUNTER — Other Ambulatory Visit: Payer: Self-pay | Admitting: Family Medicine

## 2020-09-05 NOTE — Patient Instructions (Signed)
Depression screen [facility] 2/9 08/17/2020 04/28/2020 02/20/2020  Decreased Interest 0 0 0  Down, Depressed, Hopeless 0 0 0  PHQ - 2 Score 0 0 0  Altered sleeping 0 1 0  Tired, decreased energy 2 1 0  Change in appetite 0 1 0  Feeling bad or failure about yourself  0 0 0  Trouble concentrating 0 2 0  Moving slowly or fidgety/restless 3 0 0  Suicidal thoughts 0 0 0  PHQ-9 Score 5 5 0  Difficult doing work/chores Not difficult at all Not difficult at all Not difficult at all

## 2020-09-07 ENCOUNTER — Telehealth (INDEPENDENT_AMBULATORY_CARE_PROVIDER_SITE_OTHER): Payer: BC Managed Care – PPO | Admitting: Family Medicine

## 2020-09-07 ENCOUNTER — Encounter: Payer: Self-pay | Admitting: Family Medicine

## 2020-09-07 ENCOUNTER — Other Ambulatory Visit: Payer: Self-pay

## 2020-09-07 VITALS — BP 138/77 | Ht 75.0 in | Wt 209.2 lb

## 2020-09-07 DIAGNOSIS — R220 Localized swelling, mass and lump, head: Secondary | ICD-10-CM

## 2020-09-07 DIAGNOSIS — I10 Essential (primary) hypertension: Secondary | ICD-10-CM

## 2020-09-07 MED ORDER — AMLODIPINE BESYLATE 5 MG PO TABS: 5.0000 mg | ORAL_TABLET | Freq: Every day | ORAL | 3 refills | Status: DC

## 2020-09-07 NOTE — Assessment & Plan Note (Signed)
In addition home blood pressures are high normal systolic typically 1 38-1 42 and I think amlodipine may be more effective for him-once again stop labetalol and he milligrams twice daily and trial amlodipine 5 mg which may be more effective

## 2020-09-07 NOTE — Progress Notes (Signed)
Phone [phone] Virtual visit via Video note   Subjective:  Chief complaint: Chief Complaint  Patient presents with  . Tongue Swelling.   This visit type was conducted due to national recommendations for restrictions regarding the COVID-19 Pandemic (e.g. social distancing).  This format is felt to be most appropriate for this patient at this time balancing risks to patient and risks to population by having him in for in person visit.  No physical exam was performed (except for noted visual exam or audio findings with Telehealth visits).    Our team/I connected with [name] at  3:40 PM EST by a video enabled telemedicine application ([email or web address] or caregility through epic) and verified that I am speaking with the correct person using two identifiers.  Location patient: [place] Location provider: [facility], office Persons participating in the virtual visit:  patient  Our team/I discussed the limitations of evaluation and management by telemedicine and the availability of in person appointments. In light of current covid-19 pandemic, patient also understands that we are trying to protect them by minimizing in office contact if at all possible.  The patient expressed consent for telemedicine visit and agreed to proceed. Patient understands insurance will be billed.   Past Medical History-  Patient Active Problem List   Diagnosis Date Noted  . Essential hypertension     Priority: Medium  . ADHD (attention deficit hyperactivity disorder), combined type 12/19/2018  . GAD (generalized anxiety disorder) 08/24/2018  . GERD (gastroesophageal reflux disease)     Medications- reviewed and updated Current Outpatient Medications  Medication Sig Dispense Refill  . amLODipine (NORVASC) 5 MG tablet Take 1 tablet (5 mg total) by mouth daily. 90 tablet 3  . escitalopram (LEXAPRO) 10 MG tablet Take 1 tablet (10 mg total) by mouth daily. 30 tablet 5  . hydrOXYzine (ATARAX/VISTARIL) 25 MG  tablet Take 0.5-1 tablets (12.5-25 mg total) by mouth every 8 (eight) hours as needed for anxiety (or insomnia. dont drive for 6 hours after taking). (Patient not taking: Reported on 09/07/2020) 30 tablet 2   No current facility-administered medications for this visit.     Objective:  BP 138/77   Ht 6\' 3"  (1.905 m)   Wt 209 lb 3.5 oz (94.9 kg)   BMI 26.15 kg/m  self reported vitals Gen: NAD, resting comfortably No visible tongue swelling Lungs: nonlabored, normal respiratory rate  Skin: appears dry, no obvious rash     Assessment and Plan   # Tongue swelling/hypertension S:2 weeks ago he states he had bit his tongue on back edge on both left and right side. Has one tooth that is slightly sharp in back left- feels it with his tongue and wonders if it could be grazing that. Also has dentist appointment coming up.   Prior had slight tingling in lower lip which has resided. Sexual SE also with lexapro has improved.   Wonders if it gets worse after taking his medications. No trouble breathing.  No trouble swallowing. Patient feels like tongue is slightly wider after taking meds when he looks but has not had wife examine tongue A/P: 40 year old male with perception of widened/larger tongue after taking labetalol 100 mg twice daily and Lexapro 10 mg in the morning.  With how mild this is does not sound like angioedema.  Since that is a potential side effect of medication we opted to trial amlodipine 5 mg instead of labetalol as per staff.  Patient would like to take Lexapro and amlodipine together in the  morning-if has ongoing issues could move amlodipine to the evening time to see if this isolates potential side effect to Lexapro-prefer to only change one medication at a time.  In addition home blood pressures are high normal systolic typically 1 38-1 42 and I think amlodipine may be more effective for him-once again stop labetalol 100 milligrams twice daily and trial amlodipine 5 mg which may be  more effective.  If he has new or worsening symptoms should contact us immediately  Recommended follow up: keep June follow up unless symptoms fail to improve or BP elevates Future Appointments  Date Time Provider Department Center  02/09/2021  8:40 AM Shelva Majestic, MD [facility]  07/02/2021  8:40 AM Durene Cal Aldine Contes, MD [facility]    Lab/Order associations:   ICD-10-CM   1. Tongue swelling  R22.0   2. Essential hypertension  I10     Meds ordered this encounter  Medications  . amLODipine (NORVASC) 5 MG tablet    Sig: Take 1 tablet (5 mg total) by mouth daily.    Dispense:  90 tablet    Refill:  3     Return precautions advised.  Tana Conch, MD

## 2020-09-11 ENCOUNTER — Encounter: Payer: Self-pay | Admitting: Family Medicine

## 2020-10-02 ENCOUNTER — Encounter: Payer: Self-pay | Admitting: Family Medicine

## 2020-10-12 ENCOUNTER — Encounter: Payer: Self-pay | Admitting: Family Medicine

## 2020-10-15 ENCOUNTER — Other Ambulatory Visit: Payer: Self-pay

## 2020-10-15 MED ORDER — VALSARTAN 80 MG PO TABS: 80.0000 mg | ORAL_TABLET | Freq: Every day | ORAL | 3 refills | Status: DC

## 2020-10-22 ENCOUNTER — Encounter: Payer: Self-pay | Admitting: Family Medicine

## 2020-10-22 ENCOUNTER — Other Ambulatory Visit: Payer: Self-pay

## 2020-10-29 ENCOUNTER — Encounter: Payer: Self-pay | Admitting: Family Medicine

## 2020-10-30 NOTE — Telephone Encounter (Signed)
Patient has been scheduled for 11/06/20

## 2020-11-04 NOTE — Progress Notes (Signed)
Phone [phone] In person visit   Subjective:   Robert Ashley is a 40 y.o. year old very pleasant male patient who presents for/with See problem oriented charting Chief Complaint  Patient presents with   Heart Problem    Chest pressure. Sweat a lot, patient body temp is fluctuating .    This visit occurred during the SARS-CoV-2 public health emergency.  Safety protocols were in place, including screening questions prior to the visit, additional usage of staff PPE, and extensive cleaning of exam room while observing appropriate contact time as indicated for disinfecting solutions.   Past Medical History-  Patient Active Problem List   Diagnosis Date Noted   GAD (generalized anxiety disorder) 08/24/2018    Priority: Medium   Essential hypertension     Priority: Medium   ADHD (attention deficit hyperactivity disorder), combined type 12/19/2018   GERD (gastroesophageal reflux disease)     Medications- reviewed and updated Current Outpatient Medications  Medication Sig Dispense Refill   amLODipine (NORVASC) 5 MG tablet Take 1 tablet (5 mg total) by mouth daily. 90 tablet 3   escitalopram (LEXAPRO) 10 MG tablet Take 1 tablet (10 mg total) by mouth daily. 30 tablet 5   Magnesium 200 MG TABS Take by mouth.     Multiple Vitamin (MULTIVITAMIN WITH MINERALS) TABS tablet Take 1 tablet by mouth daily.     valsartan (DIOVAN) 80 MG tablet Take 1 tablet (80 mg total) by mouth daily. 90 tablet 3   hydrOXYzine (ATARAX/VISTARIL) 25 MG tablet Take 0.5-1 tablets (12.5-25 mg total) by mouth every 8 (eight) hours as needed for anxiety (or insomnia. dont drive for 6 hours after taking). (Patient not taking: No sig reported) 30 tablet 2   No current facility-administered medications for this visit.     Objective:  BP 136/80    Pulse 96    Temp 99.3 F (37.4 C) (Temporal)    Ht 6\' 3"  (1.905 m)    Wt 203 lb 12.8 oz (92.4 kg)    SpO2 98%    BMI 25.47 kg/m  Gen: NAD, resting  comfortably CV: RRR no murmurs rubs or gallops No chest wall tenderness Lungs: CTAB no crackles, wheeze, rhonchi Abdomen: soft/nontender/nondistended/normal bowel sounds. No rebound or guarding.   Ext: no edema Skin: warm, dry Neuro: grossly normal, moves all extremities     Assessment and Plan   #hypertension S: medication: Amlodipine 5 mg, valsartan 80 mg  Was also having some nighttime twitching- did better with magnesium glycinate last night BP Readings from Last 3 Encounters:  11/06/20 136/80  09/07/20 138/77  08/17/20 139/78  A/P: Stable. Continue current medications.   #Chest pressure vs. Sharp pains.  S:patient will feel epigastric pain at times or left chest a few cm above nipple or out to the left with a sharp pain. Occasional pressure in central chest. He does not have history of chest pain prior to 4-5 months ago. Has had EKG on 06/18/20 and 06/19/20 in ER - normal other than right axis. No palpitations.  Pain/pressure has not been related to meals or certain foods- less likely GERD.   Feels like always notes at least a low level throughout the day. If gets distracted may not feel it. snow boarded in February and did not have worsening pain with that. Felt like perhaps could not catch his breath as quickly- perhaps winded more quickly . Also with CXR on 06/18/20 in ER  Also notes some temperature dysregulation- may be cold and he feels  hot or the opposite feeling cold where he should not be. Got slightly nauseous but no increased chest pain thyroid October 2021 was in normal range on TSH A/P: 40 year old male with risk factors for heart disease including hypertension and mild hyperlipidemia (no strong family history of early cardiac disease) presenting with atypical chest pain.  I think at his age it would be reasonable to do a coronary calcium scoring to get a baseline-if he does have some plaque would be reasonable to be aggressive with statin therapy.  If score comes back  at 0 would be very reassuring.  If he has new or worsening symptoms he will let us know. I was honest with him I do not have a strong suspicion this is cardiac but that the risks/benefits of this even from a peace of mind perspective makes it worthwhile- he agrees and it would also be reassuring for his wife who had the idea originally  For temperature dysregulation-thyroid has been normal.  If has worsening symptoms we can certainly repeat in the future but I do not feel strongly about rechecking at this time  # Anxiety S:Medication: lexapro 10mg  . No thoughts of self harm GAD 7 : Generalized Anxiety Score 08/17/2020 04/28/2020 08/23/2018  Nervous, Anxious, on Edge 1 1 2   Control/stop worrying 1 1 2   Worry too much - different things 1 1 2   Trouble relaxing 0 1 3  Restless 0 0 3  Easily annoyed or irritable 1 1 1   Afraid - awful might happen 0 1 0  Total GAD 7 Score 4 6 13   Anxiety Difficulty Not difficult at all Somewhat difficult Very difficult  A/P: lexapro seems to be working reasonably well. Some of chest pain could be anxiety related but want to evaluate for other potential cause. Has not had to use hydroxyzine but will keep on hand.     Recommended follow up: keep October visit only Future Appointments  Date Time Provider Department Center  02/09/2021  8:40 AM , MD [facility]  07/02/2021  8:40 AM , November, MD [facility]    Lab/Order associations:   ICD-10-CM   1. Atypical chest pain  R07.89 CT CARDIAC SCORING (SELF PAY ONLY)  2. Essential hypertension  I10   3. GAD (generalized anxiety disorder)  F41.1    Return precautions advised.  Shelva Majestic, MD

## 2020-11-04 NOTE — Patient Instructions (Addendum)
Cancel June visit at the desk  We will call you within two weeks about your referral for coronary calcium scoring. If you do not hear within 2 weeks, give [place] a call.   Let us know if new or worsening symptoms

## 2020-11-05 ENCOUNTER — Encounter: Payer: Self-pay | Admitting: Family Medicine

## 2020-11-06 ENCOUNTER — Other Ambulatory Visit: Payer: Self-pay

## 2020-11-06 ENCOUNTER — Encounter: Payer: Self-pay | Admitting: Family Medicine

## 2020-11-06 ENCOUNTER — Ambulatory Visit (INDEPENDENT_AMBULATORY_CARE_PROVIDER_SITE_OTHER): Payer: BC Managed Care – PPO | Admitting: Family Medicine

## 2020-11-06 VITALS — BP 136/80 | HR 96 | Temp 99.3°F | Ht 75.0 in | Wt 203.8 lb

## 2020-11-06 DIAGNOSIS — F411 Generalized anxiety disorder: Secondary | ICD-10-CM

## 2020-11-06 DIAGNOSIS — I10 Essential (primary) hypertension: Secondary | ICD-10-CM | POA: Diagnosis not present

## 2020-11-06 DIAGNOSIS — R0789 Other chest pain: Secondary | ICD-10-CM | POA: Diagnosis not present

## 2020-11-19 ENCOUNTER — Emergency Department (HOSPITAL_COMMUNITY): Payer: BC Managed Care – PPO

## 2020-11-19 ENCOUNTER — Emergency Department (HOSPITAL_COMMUNITY)
Admission: EM | Admit: 2020-11-19 | Discharge: 2020-11-19 | Disposition: A | Discharge status: Home / Self Care | Payer: BC Managed Care – PPO | Attending: Emergency Medicine | Admitting: Emergency Medicine

## 2020-11-19 DIAGNOSIS — R55 Syncope and collapse: Secondary | ICD-10-CM | POA: Diagnosis not present

## 2020-11-19 DIAGNOSIS — I10 Essential (primary) hypertension: Secondary | ICD-10-CM | POA: Diagnosis not present

## 2020-11-19 DIAGNOSIS — R61 Generalized hyperhidrosis: Secondary | ICD-10-CM | POA: Diagnosis not present

## 2020-11-19 DIAGNOSIS — Z87891 Personal history of nicotine dependence: Secondary | ICD-10-CM | POA: Diagnosis not present

## 2020-11-19 DIAGNOSIS — W19XXXA Unspecified fall, initial encounter: Secondary | ICD-10-CM | POA: Diagnosis not present

## 2020-11-19 DIAGNOSIS — Z79899 Other long term (current) drug therapy: Secondary | ICD-10-CM | POA: Diagnosis not present

## 2020-11-19 DIAGNOSIS — R079 Chest pain, unspecified: Secondary | ICD-10-CM | POA: Diagnosis not present

## 2020-11-19 DIAGNOSIS — S0990XA Unspecified injury of head, initial encounter: Secondary | ICD-10-CM | POA: Diagnosis not present

## 2020-11-19 LAB — BASIC METABOLIC PANEL
Anion gap: 10 (ref 5–15)
BUN: 11 mg/dL (ref 6–20)
CO2: 26 mmol/L (ref 22–32)
Calcium: 9.3 mg/dL (ref 8.9–10.3)
Chloride: 100 mmol/L (ref 98–111)
Creatinine, Ser: 0.97 mg/dL (ref 0.61–1.24)
GFR, Estimated: >60 mL/min (ref >60)
Glucose, Bld: 96 mg/dL (ref 70–99)
Potassium: 3.3 mmol/L — ABNORMAL LOW (ref 3.5–5.1)
Sodium: 136 mmol/L (ref 135–145)

## 2020-11-19 LAB — CBC
HCT: 45.7 % (ref 39.0–52.0)
Hemoglobin: 15.9 g/dL (ref 13.0–17.0)
MCH: 31.5 pg (ref 26.0–34.0)
MCHC: 34.8 g/dL (ref 30.0–36.0)
MCV: 90.5 fL (ref 80.0–100.0)
Platelets: 268 10*3/uL (ref 150–400)
RBC: 5.05 MIL/uL (ref 4.22–5.81)
RDW: 12.6 % (ref 11.5–15.5)
WBC: 8.2 10*3/uL (ref 4.0–10.5)
nRBC: 0 % (ref 0.0–0.2)

## 2020-11-19 LAB — TROPONIN I (HIGH SENSITIVITY): Troponin I (High Sensitivity): 2 ng/L (ref <18)

## 2020-11-19 NOTE — Discharge Instructions (Signed)
Your work-up today was overall reassuring aside from very mildly decreased potassium and the slight abnormal appearance of your EKG.  As we discussed, cardiology reviewed and did not suspect any concerning findings however they did initially recommend admission for echo and further monitoring.  After having watched you in the emergency department for over 5 hours with no significant abnormalities and your otherwise well appearance, we feel it is reasonable for you to go home with good return precautions and plans to follow-up with outpatient cardiology.  Please rest and stay hydrated.  If any symptoms change or worsen, please return to the nearest emergency department immediately.

## 2020-11-19 NOTE — ED Notes (Signed)
Patient transported to X-ray 

## 2020-11-19 NOTE — ED Triage Notes (Signed)
Pt is here today due to LOC. Pt states an hour ago he was sitting in the barber chair and witnesses states he fell to the ground hitting his head. Pt states he has similar episodes x2 years ago but states nothing was found from it. Pt states he thinks something is going on with his heart.

## 2020-11-19 NOTE — ED Provider Notes (Signed)
[facility] Provider Note   CSN: [id number] Arrival date & time: 11/19/20  1246     History Chief Complaint  Patient presents with  . Loss of Consciousness    Robert RioStephen Ashley is a 40 y.o. male.  The history is provided by the patient and medical records. No language interpreter was used.  Loss of Consciousness Episode history:  Single Most recent episode:  Today Timing:  Rare Progression:  Resolved Chronicity:  Recurrent Witnessed: yes   Relieved by:  Nothing Worsened by:  Nothing Ineffective treatments:  None tried Associated symptoms: diaphoresis   Associated symptoms: no chest pain, no confusion, no dizziness, no fever, no focal weakness, no headaches, no malaise/fatigue, no nausea, no palpitations, no recent fall, no recent injury, no recent surgery, no rectal bleeding, no seizures, no shortness of breath, no visual change, no vomiting and no weakness   Risk factors: no congenital heart disease, no coronary artery disease and no seizures        Past Medical History:  Diagnosis Date  . Chicken pox   . Elevated blood pressure reading    intermittent elevations when checking at pharmacy  . GERD (gastroesophageal reflux disease)    tums prn    Patient Active Problem List   Diagnosis Date Noted  . ADHD (attention deficit hyperactivity disorder), combined type 12/19/2018  . GAD (generalized anxiety disorder) 08/24/2018  . Essential hypertension   . GERD (gastroesophageal reflux disease)     Past Surgical History:  Procedure Laterality Date  . none         Family History  Problem Relation Age of Onset  . Healthy Mother   . Hyperlipidemia Father   . Hypertension Father   . Lung cancer Maternal Grandfather        smoker  . Stroke Paternal Grandmother        led to death mid 7250s  . Hypertension Paternal Grandmother   . Dementia Maternal Grandmother   . Glaucoma Paternal Grandfather     Social History   Tobacco  Use  . Smoking status: Former Smoker    Packs/day: 1.00    Years: 6.00    Pack years: 6.00    Quit date: 09/05/2010    Years since quitting: 10.2  . Smokeless tobacco: Never Used  Substance Use Topics  . Alcohol use: Yes    Comment: Social 2-3  . Drug use: No    Home Medications Prior to Admission medications   Medication Sig Start Date End Date Taking? Authorizing Provider  amLODipine (NORVASC) 5 MG tablet Take 1 tablet (5 mg total) by mouth daily. 09/07/20   Shelva MajesticHunter, Margues O, MD  escitalopram (LEXAPRO) 10 MG tablet Take 1 tablet (10 mg total) by mouth daily. 06/30/20   Shelva MajesticHunter, Nyshaun O, MD  hydrOXYzine (ATARAX/VISTARIL) 25 MG tablet Take 0.5-1 tablets (12.5-25 mg total) by mouth every 8 (eight) hours as needed for anxiety (or insomnia. dont drive for 6 hours after taking). Patient not taking: No sig reported 06/30/20   Shelva MajesticHunter, Jakayden O, MD  Magnesium 200 MG TABS Take by mouth.    [provider]  Multiple Vitamin (MULTIVITAMIN WITH MINERALS) TABS tablet Take 1 tablet by mouth daily.    [provider]  valsartan (DIOVAN) 80 MG tablet Take 1 tablet (80 mg total) by mouth daily. 10/15/20   Shelva MajesticHunter, Jaymian O, MD    Allergies    Patient has no known allergies.  Review of Systems   Review  of Systems  Constitutional: Positive for diaphoresis. Negative for chills, fatigue, fever and malaise/fatigue.  HENT: Negative for congestion.   Eyes: Negative for visual disturbance.  Respiratory: Negative for chest tightness, shortness of breath and wheezing.   Cardiovascular: Positive for syncope. Negative for chest pain, palpitations and leg swelling.  Gastrointestinal: Negative for abdominal pain, constipation, diarrhea, nausea and vomiting.  Genitourinary: Negative for flank pain and frequency.  Musculoskeletal: Negative for back pain, neck pain and neck stiffness.  Neurological: Positive for light-headedness. Negative for dizziness, focal weakness, seizures, weakness,  numbness and headaches.  Psychiatric/Behavioral: Negative for agitation and confusion.  All other systems reviewed and are negative.   Physical Exam Updated Vital Signs BP 128/75 (BP Location: Right Arm)   Pulse 72   Temp 98.6 F (37 C)   Resp 18   SpO2 100%   Physical Exam Vitals and nursing note reviewed.  Constitutional:      General: He is not in acute distress.    Appearance: He is well-developed. He is not ill-appearing, toxic-appearing or diaphoretic.  HENT:     Head: Normocephalic and atraumatic.     Nose: No congestion or rhinorrhea.     Mouth/Throat:     Mouth: Mucous membranes are moist.     Pharynx: No oropharyngeal exudate or posterior oropharyngeal erythema.  Eyes:     Extraocular Movements: Extraocular movements intact.     Conjunctiva/sclera: Conjunctivae normal.     Pupils: Pupils are equal, round, and reactive to light.  Cardiovascular:     Rate and Rhythm: Normal rate and regular rhythm.     Pulses: Normal pulses.     Heart sounds: No murmur heard.   Pulmonary:     Effort: Pulmonary effort is normal. No respiratory distress.     Breath sounds: Normal breath sounds. No wheezing, rhonchi or rales.  Chest:     Chest wall: No tenderness.  Abdominal:     General: Abdomen is flat.     Palpations: Abdomen is soft.     Tenderness: There is no abdominal tenderness. There is no right CVA tenderness, left CVA tenderness, guarding or rebound.  Musculoskeletal:        General: No tenderness.     Cervical back: Neck supple. No tenderness.     Right lower leg: No edema.     Left lower leg: No edema.  Skin:    General: Skin is warm and dry.     Capillary Refill: Capillary refill takes less than 2 seconds.     Findings: No erythema.  Neurological:     General: No focal deficit present.     Mental Status: He is alert.     Cranial Nerves: No cranial nerve deficit.     Sensory: No sensory deficit.     Motor: No weakness.  Psychiatric:        Mood and Affect:  Mood normal.     ED Results / Procedures / Treatments   Labs (all labs ordered are listed, but only abnormal results are displayed) Labs Reviewed  BASIC METABOLIC PANEL - Abnormal; Notable for the following components:      Result Value   Potassium 3.3 (*)    All other components within normal limits  CBC  TSH  MAGNESIUM  TROPONIN I (HIGH SENSITIVITY)  TROPONIN I (HIGH SENSITIVITY)    EKG EKG Interpretation  Date/Time:  Thursday November 19 2020 12:53:29 EDT Ventricular Rate:  82 PR Interval:  160 QRS Duration: 92 QT Interval:  390 QTC Calculation: 455 R Axis:   112 Text Interpretation: Sinus rhythm with Premature atrial complexes with Abberant conduction Right axis deviation Abnormal ECG when compared to prior, similar appearance with possible delta wave? No STEMI Confirmed by Theda Belfast ([id number]) on 11/19/2020 3:48:24 PM   Radiology DG Chest 2 View  Result Date: 11/19/2020 CLINICAL DATA:  Chest pain. Additional provided: Syncopal episode, patient reportedly fell out of barber chair. EXAM: CHEST - 2 VIEW COMPARISON:  Chest radiographs 06/18/2020. FINDINGS: Heart size within normal limits. No appreciable airspace consolidation. No evidence of pleural effusion or pneumothorax. No acute bony abnormality identified. Mild thoracic levocurvature. IMPRESSION: No evidence of active cardiopulmonary disease. Mild thoracic levocurvature. Electronically Signed   By: Jackey Loge DO   On: 11/19/2020 13:53   CT Head Wo Contrast  Result Date: 11/19/2020 CLINICAL DATA:  Head trauma, moderate/severe; fall. Syncopal episode. EXAM: CT HEAD WITHOUT CONTRAST TECHNIQUE: Contiguous axial images were obtained from the base of the skull through the vertex without intravenous contrast. COMPARISON:  Brain MRI 06/18/2020.  Head CT 06/18/2020. FINDINGS: Brain: Cerebral volume is normal. Redemonstrated prominent perivascular space within the right frontoparietal white matter. No demarcated cortical infarct.  No extra-axial fluid collection. No evidence of intracranial mass. No midline shift. Vascular: No hyperdense vessel. Skull: Normal. Negative for fracture or focal lesion. Sinuses/Orbits: Visualized orbits show no acute finding. No significant paranasal sinus disease at the imaged levels. IMPRESSION: Unremarkable non-contrast CT appearance of the brain. No evidence of acute intracranial abnormality. Electronically Signed   By: Jackey Loge DO   On: 11/19/2020 13:52    Procedures Procedures   Medications Ordered in ED Medications - No data to display  ED Course  I have reviewed the triage vital signs and the nursing notes.  Pertinent labs & imaging results that were available during my care of the patient were reviewed by me and considered in my medical decision making (see chart for details).    MDM Rules/Calculators/A&P                          Robert Ashley is a 40 y.o. male with a past medical history significant for hypertension, GERD, anxiety, and ADHD who presents for syncopal episode.  According to patient, today at the barbershop, he felt an episode of lightheadedness hitting him and he tried to drink some water.  It then returned and he tried to get to the bathroom but ended up having a syncopal episode falling onto the floor.  He was told that he hit his head but he is not examined headache.  Patient had head CT ordered while in triage and waiting to be seen.  He reports that he feels left-sided chest pain on and off, several times a week and does not feel that anything provokes it.  He reports he was seen for a cardiac work-up last year and was told it was reassuring.  He is also follow-up with neurology due to some right arm numbness he had previously.  He says that he is fully passed out 2 times in the last year and has had several episodes of extreme near syncope but not actually losing consciousness.  He reports he never feels any palpitations and he does not think his chest pain  always happens with his syncopal or lightheaded episodes.  He reports he is been trying to stay hydrated as increasing fluid seems to help with his lightheaded episodes.  He denies any trauma  otherwise.  Denies any significant numbness, tingling, weakness at this time.  Denies any new leg pain or leg swelling.  Denies any fevers, chills, congestion, nausea, vomiting, constipation, diarrhea, or urinary changes.  Does report occasional cough chronically.  On exam, lungs are clear and chest is nontender.  Abdomen is nontender.  No murmur.  Symmetric pulses in upper and lower extremities.  No lower extremity tenderness or edema.  Patient well-appearing.  EKG did not show STEMI but I was concern about possible delta wave which could indicate underlying WPW.  EKG did look similar to prior however.  I called cardiology and spoke to Dr. Armanda Magic.   She looked at the EKG and was not convinced that it was WPW but due to his syncopal episode and lightheaded episodes, she did feel an admission was warranted for cardiac echo and further monitoring overnight.  Patient had screening labs also in triage that shows slightly decreased potassium but otherwise had normal kidney function and normal troponin initially.  Will trend.  We will add on a TSH and will also add on a magnesium level.  CT head did not show any acute injuries and chest x-ray did not show pneumonia or other acute abnormality.  We will discuss admission with the patient and see if he would like to follow-up as an outpatient or be admitted due to his symptoms today.   Patient reports he is not interested in admission.  The second troponin, TSH, and exam have not yet returned however he would like to go home now.  We will have him follow-up with cardiology and he will follow-up on these results with his PCP and on my chart.  He understands to maintain hydration.  He was observed for just over 5 hours with no significant arrhythmias seen.  Patient  discharged in stable condition with plans to follow-up with cardiology and PCP and to maintain hydration.  He clearly understand strict return precautions.    Final Clinical Impression(s) / ED Diagnoses Final diagnoses:  Syncope and collapse    Rx / DC Orders ED Discharge Orders         Ordered    Ambulatory referral to Cardiology       Comments: Multiple episodes of syncope over the last year and possible EKG abnormalities.  Will likely need outpatient echo and further cardiac work-up.   11/19/20 1805          Clinical Impression: 1. Syncope and collapse     Disposition: Discharge  Condition: Stable  I have discussed the results, Dx and Tx plan with the pt(& family if present). He/she/they expressed understanding and agree(s) with the plan. Discharge instructions discussed at great length. Strict return precautions discussed and pt &/or family have verbalized understanding of the instructions. No further questions at time of discharge.    New Prescriptions   No medications on file    Follow Up: [facility] [address] [place] [address] [phone] Call       Jonquil Stubbe, Canary Brim, MD 11/19/20 [phone]

## 2021-01-03 NOTE — Progress Notes (Signed)
Cardiology Office Note:    Date:  01/04/2021   ID:  Robert Ashley, DOB 10/16/1980, MRN [id number]  PCP:  Robert Majestic, MD   [facility]  Cardiologist:  Christell Constant, MD  Advanced Practice Provider:  No care team member to display Electrophysiologist:  None       Referring MD: Robert Majestic, MD   CC: multiple episodes of lightheadness Consulted for the evaluation of syncope at the behest of Robert Majestic, MD   History of Present Illness:    Robert Ashley is a 40 y.o. male with a hx of HTN, ADHD, Marijuana use who presents for evaluation 01/04/21.  Patient notes that he is feeling slightly off.    Had 11/19/20 syncopal event- lightheadedness at a barbershop, somewhat improved with water, tried to go to the bathroom (normally can lay down when these symptoms occur) and passed out; hit his head on the floor.  ED work up for possible inferior delta wave.  Patient had been planned for inpatient evaluation but deferred.  Patient notes that he has has multiple episodes of stroke like symptoms in the past and was concerned that this was from taking too much adderol.  Hasn't touched that since and is still having symptoms.    Chest pressure in the center occurred 2 days ago (1/10).  Also notes chest pain (stabbing) multiple times a day for the past two weeks.  Worsens with inspiration. Discomfort occurs as should pain in both arms, worsens after lunch, and improves with stretching; 1/10. Notes day and night time diaphoresis.   Patient exertion notable for biking in [place]  and feels no symptoms.  No shortness of breath, but notes DOE.  Notes a constant dry cough. Notes fatigue in general.   Notes sudden onset palpitations that cause lightheadedness and near syncope.  Notes that he drinks about 5 alcoholic drinks a day and is working to cut this down.  Smokes marijuana with vape pen and had this that day.  Occurred once a month.  Sometimes occurs  when he feels very hot.  Patient reports NO prior cardiac testing including  echo,  stress test,  heart catheterizations,  cardioversion,  ablations.  Ambulatory BP 135/70 on average.   Past Medical History:  Diagnosis Date  . Chicken pox   . Elevated blood pressure reading    intermittent elevations when checking at pharmacy  . GERD (gastroesophageal reflux disease)    tums prn    Past Surgical History:  Procedure Laterality Date  . none      Current Medications: Current Meds  Medication Sig  . amLODipine (NORVASC) 5 MG tablet Take 1 tablet (5 mg total) by mouth daily.  Marland Kitchen escitalopram (LEXAPRO) 10 MG tablet Take 1 tablet (10 mg total) by mouth daily.  . Magnesium 100 MG CAPS Take 200 mg by mouth at bedtime.  . Multiple Vitamin (MULTIVITAMIN WITH MINERALS) TABS tablet Take 1 tablet by mouth daily.  . valsartan (DIOVAN) 80 MG tablet Take 1 tablet (80 mg total) by mouth daily.     Allergies:   Patient has no known allergies.   Social History   Socioeconomic History  . Marital status: Married    Spouse name: Robert Ashley  . Number of children: 2  . Years of education: Not on file  . Highest education level: Not on file  Occupational History  . Occupation: Full time2  Tobacco Use  . Smoking status: Former Smoker    Packs/day:  1.00    Years: 6.00    Pack years: 6.00    Quit date: 09/05/2010    Years since quitting: 10.3  . Smokeless tobacco: Never Used  Substance and Sexual Activity  . Alcohol use: Yes    Comment: Social 2-3  . Drug use: No  . Sexual activity: Yes  Other Topics Concern  . 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       Lives with wife and 2 children   Right handed   Drinks no caffeine/ was drinking 1 high caffeine [employer] daily    Social Determinants of Health   Financial Resource Strain: Not on file  Food Insecurity: Not on  file  Transportation Needs: Not on file  Physical Activity: Not on file  Stress: Not on file  Social Connections: Not on file    Social: two kids, wife had a postpartum PE and she is a PA  Family History: The patient's family history includes Dementia in his maternal grandmother; Glaucoma in his paternal grandfather; Healthy in his mother; Hyperlipidemia in his father; Hypertension in his father and paternal grandmother; Lung cancer in his maternal grandfather; Stroke in his paternal grandmother. History of coronary artery disease notable for no members. History of heart failure notable for no members. History of arrhythmia notable for M grandmother NOS. Denies family history of sudden cardiac death including drowning, car accidents, or unexplained deaths in the family. No history of bicuspid aortic valve or aortic aneurysm or dissection.   No history of cardiomyopathies including hypertrophic cardiomyopathy, left ventricular non-compaction, or arrhythmogenic right ventricular cardiomyopathy.  ROS:   Please see the history of present illness.     All other systems reviewed and are negative.  EKGs/Labs/Other Studies Reviewed:    The following studies were reviewed today:  EKG:   11/20/20:  Artifact noted- cannot rule out inferior delta wave 01/04/21: SR rate 73 RAD cannot exclude inferior delta waves, PR 150  Recent Labs: 06/18/2020: ALT 32 06/30/2020: TSH 1.10 11/19/2020: BUN 11; Creatinine, Ser 0.97; Hemoglobin 15.9; Platelets 268; Potassium 3.3; Sodium 136  Recent Lipid Panel    Component Value Date/Time   CHOL 196 06/30/2020 1049   TRIG 102 06/30/2020 1049   HDL 59 06/30/2020 1049   CHOLHDL 3.3 06/30/2020 1049   VLDL 13.2 06/28/2016 0900   LDLCALC 117 (H) 06/30/2020 1049     Risk Assessment/Calculations:     N/A  Physical Exam:    VS:  BP 135/72   Pulse 73   Ht 6\' 3"  (1.905 m)   Wt 203 lb (92.1 kg)   SpO2 100%   BMI 25.37 kg/m     Orthostatic Vitals: Supine:   BP 135/72  HR 80 Sitting:  BP 131/71  HR 82 Standing:  BP 140/76  HR 88 Prolonged Stand:  BP 140/77  HR 85   Wt Readings from Last 3 Encounters:  01/04/21 203 lb (92.1 kg)  11/06/20 203 lb 12.8 oz (92.4 kg)  09/07/20 209 lb 3.5 oz (94.9 kg)     GEN:  Well nourished, well developed in no acute distress HEENT: Normal NECK: No JVD; No carotid bruits LYMPHATICS: No lymphadenopathy CARDIAC: RRR, no murmurs, rubs, gallops RESPIRATORY:  Clear to auscultation without rales, wheezing or rhonchi  ABDOMEN: Soft, non-tender, non-distended MUSCULOSKELETAL:  No edema; No deformity  SKIN: Warm and dry NEUROLOGIC:  Alert  and oriented x 3 PSYCHIATRIC:  Normal affect   ASSESSMENT:    1. Syncope and collapse   2. Essential hypertension   3. ADHD (attention deficit hyperactivity disorder), combined type   4. Marijuana use    PLAN:    In order of problems listed above:  Syncope HTN Marijuana use and Alcohol Use - discussed cessation of marijuana and alcohol decrease in frequent - continue norvasc and valsartan and will continue amb monitoring - Will get Echocardiogram - Will get Live 30 day Preventice Monitor for SVT question - Will get POET for query of accessory pathway - Will rule out cardiac issues as this could also be vasogal syncope   Shared Decision Making/Informed Consent The risks [chest pain, shortness of breath, cardiac arrhythmias, dizziness, blood pressure fluctuations, myocardial infarction, stroke/transient ischemic attack, and life-threatening complications (estimated to be 1 in 10,000)], benefits (risk stratification, diagnosing coronary artery disease, treatment guidance) and alternatives of an exercise tolerance test were discussed in detail with Mr. Blaylock and he agrees to proceed.      Medication Adjustments/Labs and Tests Ordered: Current medicines are reviewed at length with the patient today.  Concerns regarding medicines are outlined above.  No orders of  the defined types were placed in this encounter.  No orders of the defined types were placed in this encounter.   There are no Patient Instructions on file for this visit.   Signed, Christell Constant, MD  01/04/2021 9:13 AM    [facility]

## 2021-01-04 ENCOUNTER — Other Ambulatory Visit: Payer: Self-pay

## 2021-01-04 ENCOUNTER — Encounter: Payer: Self-pay | Admitting: Internal Medicine

## 2021-01-04 ENCOUNTER — Ambulatory Visit (INDEPENDENT_AMBULATORY_CARE_PROVIDER_SITE_OTHER): Payer: BC Managed Care – PPO | Admitting: Internal Medicine

## 2021-01-04 ENCOUNTER — Encounter: Payer: Self-pay | Admitting: Radiology

## 2021-01-04 VITALS — BP 135/72 | HR 73 | Ht 75.0 in | Wt 203.0 lb

## 2021-01-04 DIAGNOSIS — F902 Attention-deficit hyperactivity disorder, combined type: Secondary | ICD-10-CM | POA: Diagnosis not present

## 2021-01-04 DIAGNOSIS — R55 Syncope and collapse: Secondary | ICD-10-CM | POA: Insufficient documentation

## 2021-01-04 DIAGNOSIS — F1290 Cannabis use, unspecified, uncomplicated: Secondary | ICD-10-CM | POA: Insufficient documentation

## 2021-01-04 DIAGNOSIS — I10 Essential (primary) hypertension: Secondary | ICD-10-CM | POA: Diagnosis not present

## 2021-01-04 NOTE — Patient Instructions (Signed)
Medication Instructions:  Your physician recommends that you continue on your current medications as directed. Please refer to the Current Medication list given to you today.  *If you need a refill on your cardiac medications before your next appointment, please call your pharmacy*   Lab Work: NONE If you have labs (blood work) drawn today and your tests are completely normal, you will receive your results only by: Marland Kitchen MyChart Message (if you have MyChart) OR . A paper copy in the mail If you have any lab test that is abnormal or we need to change your treatment, we will call you to review the results.   Testing/Procedures: Your physician has requested that you wear a heart monitor.  Your physician has requested that you have an echocardiogram. Echocardiography is a painless test that uses sound waves to create images of your heart. It provides your doctor with information about the size and shape of your heart and how well your heart's chambers and valves are working. This procedure takes approximately one hour. There are no restrictions for this procedure.  Your physician has requested that you have a Stress Test.      Follow-Up: At [facility], you and your health needs are our priority.  As part of our continuing mission to provide you with exceptional heart care, we have created designated Provider Care Teams.  These Care Teams include your primary Cardiologist (physician) and Advanced Practice Providers (APPs -  Physician Assistants and Nurse Practitioners) who all work together to provide you with the care you need, when you need it.  We recommend signing up for the patient portal called "MyChart".  Sign up information is provided on this After Visit Summary.  MyChart is used to connect with patients for Virtual Visits (Telemedicine).  Patients are able to view lab/test results, encounter notes, upcoming appointments, etc.  Non-urgent messages can be sent to your provider as well.    To learn more about what you can do with MyChart, go to [email or web address].    Your next appointment:   6-8 month(s)  The format for your next appointment:   In Person  Provider:   You may see Christell Constant, MD or one of the following Advanced Practice Providers on your designated Care Team:    Ronie Spies, PA-C  Jacolyn Reedy, PA-C    Other Instructions  [employer] Cardiac Event Monitor Instructions Your physician has requested you wear your cardiac event monitor for _____ days, (1-30). [employer] may call or text to confirm a shipping address. The monitor will be sent to a land address via UPS. [employer] will not ship a monitor to a PO BOX. It typically takes 3-5 days to receive your monitor after it has been enrolled. [employer] will assist with USPS tracking if your package is delayed. The telephone number for [employer] is [phone]. Once you have received your monitor, please review the enclosed instructions. Instruction tutorials can also be viewed under help and settings on the enclosed cell phone. Your monitor has already been registered assigning a specific monitor serial # to you.  Applying the monitor Remove cell phone from case and turn it on. The cell phone works as IT consultant and needs to be within [employer] of you at all times. The cell phone will need to be charged on a daily basis. We recommend you plug the cell phone into the enclosed charger at your bedside table every night.  Monitor batteries: You will receive two monitor batteries labelled #1 and #  2. These are your recorders. Plug battery #2 onto the second connection on the enclosed charger. Keep one battery on the charger at all times. This will keep the monitor battery deactivated. It will also keep it fully charged for when you need to switch your monitor batteries. A small light will be blinking on the battery emblem when it is charging. The light on the battery emblem will  remain on when the battery is fully charged.  Open package of a Monitor strip. Insert battery #1 into black hood on strip and gently squeeze monitor battery onto connection as indicated in instruction booklet. Set aside while preparing skin.  Choose location for your strip, vertical or horizontal, as indicated in the instruction booklet. Shave to remove all hair from location. There cannot be any lotions, oils, powders, or colognes on skin where monitor is to be applied. Wipe skin clean with enclosed Saline wipe. Dry skin completely.  Peel paper labeled #1 off the back of the Monitor strip exposing the adhesive. Place the monitor on the chest in the vertical or horizontal position shown in the instruction booklet. One arrow on the monitor strip must be pointing upward. Carefully remove paper labeled #2, attaching remainder of strip to your skin. Try not to create any folds or wrinkles in the strip as you apply it.  Firmly press and release the circle in the center of the monitor battery. You will hear a small beep. This is turning the monitor battery on. The heart emblem on the monitor battery will light up every 5 seconds if the monitor battery in turned on and connected to the patient securely. Do not push and hold the circle down as this turns the monitor battery off. The cell phone will locate the monitor battery. A screen will appear on the cell phone checking the connection of your monitor strip. This may read poor connection initially but change to good connection within the next minute. Once your monitor accepts the connection you will hear a series of 3 beeps followed by a climbing crescendo of beeps. A screen will appear on the cell phone showing the two monitor strip placement options. Touch the picture that demonstrates where you applied the monitor strip.  Your monitor strip and battery are waterproof. You are able to shower, bathe, or swim with the monitor on. They just ask you  do not submerge deeper than 3 feet underwater. We recommend removing the monitor if you are swimming in a lake, river, or ocean.  Your monitor battery will need to be switched to a fully charged monitor battery approximately once a week. The cell phone will alert you of an action which needs to be made.  On the cell phone, tap for details to reveal connection status, monitor battery status, and cell phone battery status. The green dots indicates your monitor is in good status. A red dot indicates there is something that needs your attention.  To record a symptom, click the circle on the monitor battery. In 30-60 seconds a list of symptoms will appear on the cell phone. Select your symptom and tap save. Your monitor will record a sustained or significant arrhythmia regardless of you clicking the button. Some patients do not feel the heart rhythm irregularities. [employer] will notify us of any serious or critical events.  Refer to instruction booklet for instructions on switching batteries, changing strips, the Do not disturb or Pause features, or any additional questions.  Call [employer] at [phone], to confirm your monitor  is transmitting and record your baseline. They will answer any questions you may have regarding the monitor instructions at that time.  Returning the monitor to [employer] Place all equipment back into blue box. Peel off strip of paper to expose adhesive and close box securely. There is a prepaid UPS shipping label on this box. Drop in a UPS drop box, or at a UPS facility like [employer]. You may also contact [employer] to arrange UPS to pick up monitor package at your home.

## 2021-01-04 NOTE — Progress Notes (Signed)
Enrolled patient for a 30 day [employer] Event Monitor to be mailed to patients home  

## 2021-01-04 NOTE — Addendum Note (Signed)
Addended by: Riley Lam A on: 01/04/2021 11:23 AM   Modules accepted: Orders

## 2021-01-12 ENCOUNTER — Ambulatory Visit (INDEPENDENT_AMBULATORY_CARE_PROVIDER_SITE_OTHER): Payer: BC Managed Care – PPO

## 2021-01-12 DIAGNOSIS — R55 Syncope and collapse: Secondary | ICD-10-CM

## 2021-01-20 ENCOUNTER — Other Ambulatory Visit: Payer: Self-pay | Admitting: Family Medicine

## 2021-01-28 ENCOUNTER — Other Ambulatory Visit: Payer: Self-pay

## 2021-01-28 ENCOUNTER — Ambulatory Visit (INDEPENDENT_AMBULATORY_CARE_PROVIDER_SITE_OTHER)
Admission: RE | Admit: 2021-01-28 | Discharge: 2021-01-28 | Disposition: A | Discharge status: Home / Self Care | Payer: Self-pay | Source: Ambulatory Visit | Attending: Family Medicine | Admitting: Family Medicine

## 2021-01-28 ENCOUNTER — Ambulatory Visit (INDEPENDENT_AMBULATORY_CARE_PROVIDER_SITE_OTHER): Payer: BC Managed Care – PPO

## 2021-01-28 ENCOUNTER — Ambulatory Visit (HOSPITAL_COMMUNITY): Payer: BC Managed Care – PPO | Attending: Cardiology

## 2021-01-28 DIAGNOSIS — R55 Syncope and collapse: Secondary | ICD-10-CM | POA: Insufficient documentation

## 2021-01-28 DIAGNOSIS — R0789 Other chest pain: Secondary | ICD-10-CM

## 2021-01-28 LAB — EXERCISE TOLERANCE TEST
Estimated workload: 8.5 METS
Exercise duration (min): 7 min
Exercise duration (sec): 0 s
MPHR: 182 {beats}/min
Peak HR: 160 {beats}/min
Percent HR: 87 %
RPE: 17
Rest HR: 89 {beats}/min

## 2021-01-28 LAB — ECHOCARDIOGRAM COMPLETE
Area-P 1/2: 2.39 cm2
S' Lateral: 2.35 cm

## 2021-02-03 ENCOUNTER — Other Ambulatory Visit (HOSPITAL_COMMUNITY): Payer: BC Managed Care – PPO

## 2021-02-09 ENCOUNTER — Ambulatory Visit: Payer: BC Managed Care – PPO | Admitting: Family Medicine

## 2021-06-24 ENCOUNTER — Encounter: Payer: Self-pay | Admitting: Family Medicine

## 2021-06-24 ENCOUNTER — Other Ambulatory Visit: Payer: Self-pay

## 2021-06-24 MED ORDER — ESCITALOPRAM OXALATE 10 MG PO TABS: 10.0000 mg | ORAL_TABLET | Freq: Every day | ORAL | 0 refills | Status: DC

## 2021-06-24 MED ORDER — AMLODIPINE BESYLATE 5 MG PO TABS: 5.0000 mg | ORAL_TABLET | Freq: Every day | ORAL | 0 refills | Status: DC

## 2021-06-24 MED ORDER — VALSARTAN 80 MG PO TABS: 80.0000 mg | ORAL_TABLET | Freq: Every day | ORAL | 0 refills | Status: DC

## 2021-07-02 ENCOUNTER — Encounter: Payer: BC Managed Care – PPO | Admitting: Family Medicine

## 2021-07-07 ENCOUNTER — Encounter: Payer: Self-pay | Admitting: Family

## 2021-07-07 ENCOUNTER — Telehealth (INDEPENDENT_AMBULATORY_CARE_PROVIDER_SITE_OTHER): Payer: BC Managed Care – PPO | Admitting: Family

## 2021-07-07 VITALS — Ht 75.0 in | Wt 203.0 lb

## 2021-07-07 DIAGNOSIS — R6889 Other general symptoms and signs: Secondary | ICD-10-CM

## 2021-07-07 MED ORDER — OSELTAMIVIR PHOSPHATE 75 MG PO CAPS: 75.0000 mg | ORAL_CAPSULE | Freq: Two times a day (BID) | ORAL | 0 refills | Status: DC

## 2021-07-07 NOTE — Progress Notes (Signed)
MyChart Video Visit    Virtual Visit via Video Note   This visit type was conducted due to national recommendations for restrictions regarding the COVID-19 Pandemic (e.g. social distancing) in an effort to limit this patient's exposure and mitigate transmission in our community. This patient is at least at moderate risk for complications without adequate follow up. This format is felt to be most appropriate for this patient at this time. Physical exam was limited by quality of the video and audio technology used for the visit. CMA was able to get the patient set up on a video visit.  Patient location: Home. Patient and provider in visit Provider location: Office  I discussed the limitations of evaluation and management by telemedicine and the availability of in person appointments. The patient expressed understanding and agreed to proceed.  Visit Date: 07/07/2021  Today's healthcare provider: Dulce Sellar, NP     Subjective:    Patient ID: Robert Ashley, male    DOB: Oct 24, 1980, 40 y.o.   MRN: [id number]  Chief Complaint  Patient presents with   Generalized Body Aches   Chills   Sore Throat   Fever    Highest has been 100.    Influenza    He says that his wife was tested here in the office on yesterday.     HPI:  Upper respiratory symptoms He complains of achiness, congestion, fever 100, and sore throat.with chills. Onset of symptoms was yesterday and gradually worsening.He is drinking moderate amounts of fluids.  Past history is significant for no history of pneumonia or bronchitis. Patient is former smoker, quit 10 years ago. Reports wife tested positive for Flu A & B yesterday, both he & wife received the flu vaccine 2 weeks ago.   Past Medical History:  Diagnosis Date   Chicken pox    Elevated blood pressure reading    intermittent elevations when checking at pharmacy   GERD (gastroesophageal reflux disease)    tums prn    Past Surgical History:   Procedure Laterality Date   none      Outpatient Medications Prior to Visit  Medication Sig Dispense Refill   amLODipine (NORVASC) 5 MG tablet Take 1 tablet (5 mg total) by mouth daily. 90 tablet 0   escitalopram (LEXAPRO) 10 MG tablet Take 1 tablet (10 mg total) by mouth daily. 90 tablet 0   Multiple Vitamin (MULTIVITAMIN WITH MINERALS) TABS tablet Take 1 tablet by mouth daily.     valsartan (DIOVAN) 80 MG tablet Take 1 tablet (80 mg total) by mouth daily. 90 tablet 0   Magnesium 100 MG CAPS Take 200 mg by mouth at bedtime. (Patient not taking: Reported on 07/07/2021)     No facility-administered medications prior to visit.    No Known Allergies      Objective:    Physical Exam Vitals and nursing note reviewed.  Constitutional:      Appearance: Normal appearance.  HENT:     Head: Normocephalic.  Pulmonary:     Effort: Pulmonary effort is normal. No respiratory distress.  Musculoskeletal:     Cervical back: Normal range of motion.  Skin:    General: Skin is dry.  Neurological:     Mental Status: He is alert and oriented to person, place, and time.  Psychiatric:        Mood and Affect: Mood normal.        Behavior: Behavior normal.    Ht 6\' 3"  (1.905 m)   Wt  203 lb 0.7 oz (92.1 kg)   BMI 25.38 kg/m   Wt Readings from Last 3 Encounters:  07/07/21 203 lb 0.7 oz (92.1 kg)  01/04/21 203 lb (92.1 kg)  11/06/20 203 lb 12.8 oz (92.4 kg)       Assessment & Plan:   Problem List Items Addressed This Visit       Other   Flu-like symptoms - Primary   Relevant Medications   oseltamivir (TAMIFLU) 75 MG capsule    Meds ordered this encounter  Medications   oseltamivir (TAMIFLU) 75 MG capsule    Sig: Take 1 capsule (75 mg total) by mouth 2 (two) times daily.    Dispense:  10 capsule    Refill:  0    Order Specific Question:   Supervising Provider    Answer:   ANDY, CAMILLE L [[id number]]    I discussed the assessment and treatment plan with the patient. The patient  was provided an opportunity to ask questions and all were answered. The patient agreed with the plan and demonstrated an understanding of the instructions.   The patient was advised to call back or seek an in-person evaluation if the symptoms worsen or if the condition fails to improve as anticipated.  I provided 21 minutes of face-to-face time during this encounter.   Dulce Sellar, NP [facility] [phone] (phone) [phone] (fax)  [facility]

## 2021-08-16 ENCOUNTER — Other Ambulatory Visit: Payer: Self-pay

## 2021-08-16 ENCOUNTER — Ambulatory Visit (INDEPENDENT_AMBULATORY_CARE_PROVIDER_SITE_OTHER): Payer: BC Managed Care – PPO | Admitting: Physician Assistant

## 2021-08-16 ENCOUNTER — Encounter: Payer: Self-pay | Admitting: Physician Assistant

## 2021-08-16 VITALS — BP 118/74 | HR 85 | Temp 98.6°F | Ht 75.0 in | Wt 227.2 lb

## 2021-08-16 DIAGNOSIS — J029 Acute pharyngitis, unspecified: Secondary | ICD-10-CM | POA: Diagnosis not present

## 2021-08-16 DIAGNOSIS — H579 Unspecified disorder of eye and adnexa: Secondary | ICD-10-CM

## 2021-08-16 LAB — POCT RAPID STREP A (OFFICE): Rapid Strep A Screen: POSITIVE — AB

## 2021-08-16 MED ORDER — POLYMYXIN B-TRIMETHOPRIM 10000-0.1 UNIT/ML-% OP SOLN: 1.0000 [drp] | Freq: Four times a day (QID) | OPHTHALMIC | 0 refills | Status: DC

## 2021-08-16 MED ORDER — AMOXICILLIN 875 MG PO TABS: 875.0000 mg | ORAL_TABLET | Freq: Two times a day (BID) | ORAL | 0 refills | Status: AC

## 2021-08-16 NOTE — Progress Notes (Signed)
Robert Ashley is a 40 y.o. male here for possible pink eye.  History of Present Illness:   Chief Complaint  Patient presents with   Eye Problem    Pt c/o eyes crusted with drainage and itching x 2 days.    HPI  Eye Irritation [name] presents with c/o eye drainage and itching that has been onset for two days. States upon waking up, his eyes are crusted and throughout the day he felt a burning sensation in his eyes. Upon further examination, Robert Ashley admitted he did have a sore throat and trouble swallowing this morning, but states this has resolved itself as the day went on. Reports his children did have pink eye about 10 days ago. Denies coughing, post nasal drip, fever, or chills.   Past Medical History:  Diagnosis Date   Chicken pox    Elevated blood pressure reading    intermittent elevations when checking at pharmacy   GERD (gastroesophageal reflux disease)    tums prn     Social History   Tobacco Use   Smoking status: Former    Packs/day: 1.00    Years: 6.00    Pack years: 6.00    Types: Cigarettes    Quit date: 09/05/2010    Years since quitting: 10.9   Smokeless tobacco: Never  Substance Use Topics   Alcohol use: Yes    Comment: Social 2-3   Drug use: No    Past Surgical History:  Procedure Laterality Date   none      Family History  Problem Relation Age of Onset   Healthy Mother    Hyperlipidemia Father    Hypertension Father    Lung cancer Maternal Grandfather        smoker   Stroke Paternal Grandmother        led to death mid 39s   Hypertension Paternal Grandmother    Dementia Maternal Grandmother    Glaucoma Paternal Grandfather     No Known Allergies  Current Medications:   Current Outpatient Medications:    amLODipine (NORVASC) 5 MG tablet, Take 1 tablet (5 mg total) by mouth daily., Disp: 90 tablet, Rfl: 0   escitalopram (LEXAPRO) 10 MG tablet, Take 1 tablet (10 mg total) by mouth daily., Disp: 90 tablet, Rfl: 0   Multiple Vitamin  (MULTIVITAMIN WITH MINERALS) TABS tablet, Take 1 tablet by mouth daily., Disp: , Rfl:    valsartan (DIOVAN) 80 MG tablet, Take 1 tablet (80 mg total) by mouth daily., Disp: 90 tablet, Rfl: 0   Review of Systems:   ROS Negative unless otherwise specified per HPI. Vitals:   Vitals:   08/16/21 1601  BP: 118/74  Pulse: 85  Temp: 98.6 F (37 C)  TempSrc: Temporal  SpO2: 98%  Weight: 227 lb 4 oz (103.1 kg)  Height: 6\' 3"  (1.905 m)     Body mass index is 28.4 kg/m.  Physical Exam:   Physical Exam Vitals and nursing note reviewed.  Constitutional:      General: He is not in acute distress.    Appearance: He is well-developed. He is not ill-appearing or toxic-appearing.  HENT:     Mouth/Throat:     Pharynx: Posterior oropharyngeal erythema present. No oropharyngeal exudate.     Tonsils: No tonsillar exudate. 2+ on the right. 2+ on the left.  Eyes:     Conjunctiva/sclera:     Right eye: Right conjunctiva is injected.     Left eye: Left conjunctiva is injected. Exudate present.  Comments: Slight exudate present left canthus    Cardiovascular:     Rate and Rhythm: Normal rate and regular rhythm.     Pulses: Normal pulses.     Heart sounds: Normal heart sounds, S1 normal and S2 normal.  Pulmonary:     Effort: Pulmonary effort is normal.     Breath sounds: Normal breath sounds.  Skin:    General: Skin is warm and dry.  Neurological:     Mental Status: He is alert.     GCS: GCS eye subscore is 4. GCS verbal subscore is 5. GCS motor subscore is 6.  Psychiatric:        Speech: Speech normal.        Behavior: Behavior normal. Behavior is cooperative.   Results for orders placed or performed in visit on 08/16/21  POCT rapid strep A  Result Value Ref Range   Rapid Strep A Screen Positive (A) Negative     Assessment and Plan:   Sore Throat No red flags on exam; he is non-toxic Strept positive in office today Start Amoxicillin 875 mg twice daily for 10 days Follow-up  if new/worsening symptoms  Bilateral eye irritation Suspect part of his URI, which his amoxicillin should treat however I did prescribe him Polytrim ophthalmic solution 1 drop in both eyes every 6 hours to have on hand should he need for local relief of symptoms Follow up if symptoms worsen or concerns occur   I,Havlyn C Ratchford,acting as a scribe for [employer], PA.,have documented all relevant documentation on the behalf of Jarold Motto, PA,as directed by  Jarold Motto, PA while in the presence of Jarold Motto, [place].  I, Jarold Motto, [place], have reviewed all documentation for this visit. The documentation on 08/16/21 for the exam, diagnosis, procedures, and orders are all accurate and complete.   Jarold Motto, PA-C

## 2021-08-25 ENCOUNTER — Other Ambulatory Visit: Payer: Self-pay

## 2021-08-25 ENCOUNTER — Encounter: Payer: Self-pay | Admitting: Family Medicine

## 2021-08-25 MED ORDER — VALSARTAN 80 MG PO TABS: 80.0000 mg | ORAL_TABLET | Freq: Every day | ORAL | 0 refills | Status: DC

## 2021-09-20 ENCOUNTER — Other Ambulatory Visit: Payer: Self-pay | Admitting: Family Medicine

## 2021-10-30 ENCOUNTER — Other Ambulatory Visit: Payer: Self-pay | Admitting: Family Medicine

## 2021-11-03 NOTE — Progress Notes (Signed)
? ?Phone: [phone] ? ?  ?Subjective:  ?Patient presents today for their annual physical. Chief complaint-noted.  ? ?See problem oriented charting- ?ROS- full  review of systems was completed and negative  ?except for: sweating when he wakes up until about noon-has felt like had temperature dysregulation issues in the past. Some cough ? ?The following were reviewed and entered/updated in epic: ?Past Medical History:  ?Diagnosis Date  ? Chicken pox   ? Elevated blood pressure reading   ? intermittent elevations when checking at pharmacy  ? GERD (gastroesophageal reflux disease)   ? tums prn  ? ?Patient Active Problem List  ? Diagnosis Date Noted  ? GAD (generalized anxiety disorder) 08/24/2018  ?  Priority: Medium   ? Essential hypertension   ?  Priority: Medium   ? Flu-like symptoms 07/07/2021  ? Syncope and collapse 01/04/2021  ? Marijuana use 01/04/2021  ? ADHD (attention deficit hyperactivity disorder), combined type 12/19/2018  ? GERD (gastroesophageal reflux disease)   ? ?Past Surgical History:  ?Procedure Laterality Date  ? none    ? ? ?Family History  ?Problem Relation Age of Onset  ? Healthy Mother   ? Hyperlipidemia Father   ? Hypertension Father   ? Lung cancer Maternal Grandfather   ?     smoker  ? Stroke Paternal Grandmother   ?     led to death mid 19s  ? Hypertension Paternal Grandmother   ? Dementia Maternal Grandmother   ? Glaucoma Paternal Grandfather   ? ? ?Medications- reviewed and updated ?Current Outpatient Medications  ?Medication Sig Dispense Refill  ? amLODipine (NORVASC) 5 MG tablet TAKE ONE TABLET BY MOUTH DAILY 30 tablet 5  ? escitalopram (LEXAPRO) 10 MG tablet TAKE ONE TABLET BY MOUTH DAILY 30 tablet 5  ? Multiple Vitamin (MULTIVITAMIN WITH MINERALS) TABS tablet Take 1 tablet by mouth daily.    ? trimethoprim-polymyxin b (POLYTRIM) ophthalmic solution Place 1 drop into both eyes every 6 (six) hours. 10 mL 0  ? valsartan (DIOVAN) 80 MG tablet Take 1 tablet (80 mg total) by mouth daily.  90 tablet 0  ? ?No current facility-administered medications for this visit.  ? ? ?Allergies-reviewed and updated ?No Known Allergies ? ?Social History  ? ?Social History Narrative  ? 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 Undergrad at [employer].   ?   ? Lives with wife and 2 children  ? Right handed  ? Drinks no caffeine/ was drinking 1 high caffeine starbucks daily   ? ? ?  ?Objective:  ?BP 140/82   Pulse 75   Temp 98.2 ?F (36.8 ?C)   Ht 6\' 3"  (1.905 m)   Wt 230 lb 9.6 oz (104.6 kg)   SpO2 98%   BMI 28.82 kg/m?  ?Gen: NAD, resting comfortably ?HEENT: Mucous membranes are moist. Oropharynx normal ?Neck: no thyromegaly ?CV: RRR no murmurs rubs or gallops ?Lungs: CTAB no crackles, wheeze, rhonchi ?Abdomen: soft/nontender/nondistended/normal bowel sounds. No rebound or guarding.  ?Ext: no edema ?Skin: warm, dry ?Neuro: grossly normal, moves all extremities, PERRLA ?   ?Assessment and Plan:  ?41 y.o. male presenting for annual physical.  ?Health Maintenance counseling: ?1. Anticipatory guidance: Patient counseled regarding regular dental exams -q6 months, eye exams -- no issues with vision but is considering as has insurance now, avoiding smoking and second hand smoke, limiting alcohol to 2 beverages per day-5 per week perhaps, no illicit drugs.   ?2. Risk  factor reduction:  Advised patient of need for regular exercise and diet rich and fruits and vegetables to reduce risk of heart attack and stroke.  ?Exercise- walks  with kids 3-4x a week for a mile- encouraged goal 150 mins per week.  ?Diet/weight management-up 27 pounds from last visit here- gained weight despite not making changes to diet but since having rather rapid gain has stabilized in recent months- agrees to check thyroid- if thyroid ok discussed gradual weight loss ?Wt Readings from Last 3 Encounters:  ?11/10/21 230 lb 9.6 oz (104.6 kg)  ?08/16/21 227 lb 4 oz (103.1 kg)  ?07/07/21 203 lb 0.7  oz (92.1 kg)  ?3. Immunizations/screenings/ancillary studies ?DISCUSSED:  ?-COVID booster vaccine #4- opts out ?Immunization History  ?Administered Date(s) Administered  ? Influenza,inj,Quad PF,6+ Mos 06/23/2019, 06/30/2020, 06/19/2021  ? Influenza-Unspecified 06/07/2016, 06/06/2018  ? PFIZER(Purple Top)SARS-COV-2 Vaccination 10/25/2019, 11/18/2019, 08/06/2020  ? Tdap 03/28/2016  ?4. Prostate cancer screening-no family history, start at age 31   ? 5. Colon cancer screening - no family history, start at age 27 ?1. Skin cancer screening/prevention-- no dermatologist. advised regular sunscreen use. Denies worrisome, changing, or new skin lesions.  ?7. Testicular cancer screening- advised monthly self exams  ?8. STD screening- patient opts and is only active with wife ?9. Smoking associated screening- former smoker- quit in 2012- just 6 pack years- no regular screening required ? ?Status of chronic or acute concerns  ? ?#hypertension ?See separate problem oriented charting ? ?#hyperlipidemia ?S: Medication:none   ?Lab Results  ?Component Value Date  ? CHOL 196 06/30/2020  ? HDL 59 06/30/2020  ? LDLCALC 117 (H) 06/30/2020  ? TRIG 102 06/30/2020  ? CHOLHDL 3.3 06/30/2020  ? A/P: with weight gain will recheck- work on weight loss/healthy eating/exercise- and check TSH ? ?#Chest pressure vs. Sharp pains.  ?S:patient with atypical chest pain last year-he was very anxious about cardiac risk and we did a CT cardiac scoring which came back at 0 and was reassuring.  We thought pretest probability of coronary disease was rather low.  Anxiety higher on probability list ? ?Also saw cardiology in may- had echo and exercise tolerance test. Also wore heart monitor as later had passing out episode while getting hair cut ?- he stopped marijuana ?A/P: chest pain has resolved- thinks was stress related- this has decreased some.  ?-no further syncope ?  ?# Anxiety ?S:Medication: lexapro 10mg  . No thoughts of self harm confirmed today   ?A/P:GAD controlled- continue current meds ? ?Recommended follow up: Return in about 6 months (around 05/13/2022) for follow up- or sooner if needed. ? ?Lab/Order associations: fasting ?  ICD-10-CM   ?1. Preventative health care  Z00.00   ?  ?2. Essential hypertension  I10 CBC with Differential/Platelet  ?  Comprehensive metabolic panel  ?  Lipid panel  ?  ?3. GAD (generalized anxiety disorder)  F41.1   ?  ?4. Atypical chest pain  R07.89   ?  ?5. Hyperlipidemia, unspecified hyperlipidemia type  E78.5 CBC with Differential/Platelet  ?  Comprehensive metabolic panel  ?  Lipid panel  ?  TSH  ?  ? ?I,Jada Bradford,acting as a scribe for Tana Conch, MD.,have documented all relevant documentation on the behalf of Minos Horak, MD,as directed by  Tana Conch, MD while in the presence of Tana Conch, MD. ? ?I, Tana Conch, MD, have reviewed all documentation for this visit. The documentation on 11/10/21 for the exam, diagnosis, procedures, and orders are all accurate and complete. ? ? ?  Return precautions advised.  ? ?Tana Conch, MD ? ? ? ?

## 2021-11-10 ENCOUNTER — Ambulatory Visit (INDEPENDENT_AMBULATORY_CARE_PROVIDER_SITE_OTHER): Payer: No Typology Code available for payment source | Admitting: Family Medicine

## 2021-11-10 ENCOUNTER — Encounter: Payer: Self-pay | Admitting: Family Medicine

## 2021-11-10 VITALS — BP 140/82 | HR 75 | Temp 98.2°F | Ht 75.0 in | Wt 230.6 lb

## 2021-11-10 DIAGNOSIS — Z0001 Encounter for general adult medical examination with abnormal findings: Secondary | ICD-10-CM | POA: Diagnosis not present

## 2021-11-10 DIAGNOSIS — R0789 Other chest pain: Secondary | ICD-10-CM | POA: Diagnosis not present

## 2021-11-10 DIAGNOSIS — I10 Essential (primary) hypertension: Secondary | ICD-10-CM

## 2021-11-10 DIAGNOSIS — F411 Generalized anxiety disorder: Secondary | ICD-10-CM

## 2021-11-10 DIAGNOSIS — E785 Hyperlipidemia, unspecified: Secondary | ICD-10-CM | POA: Diagnosis not present

## 2021-11-10 DIAGNOSIS — Z0000 Encounter for general adult medical examination without abnormal findings: Secondary | ICD-10-CM

## 2021-11-10 LAB — COMPREHENSIVE METABOLIC PANEL
ALT: 83 U/L — ABNORMAL HIGH (ref 0–53)
AST: 66 U/L — ABNORMAL HIGH (ref 0–37)
Albumin: 4.6 g/dL (ref 3.5–5.2)
Alkaline Phosphatase: 90 U/L (ref 39–117)
BUN: 17 mg/dL (ref 6–23)
CO2: 25 mEq/L (ref 19–32)
Calcium: 9.8 mg/dL (ref 8.4–10.5)
Chloride: 101 mEq/L (ref 96–112)
Creatinine, Ser: 0.95 mg/dL (ref 0.40–1.50)
GFR: 100.66 mL/min (ref >60.00)
Glucose, Bld: 87 mg/dL (ref 70–99)
Potassium: 4.1 mEq/L (ref 3.5–5.1)
Sodium: 138 mEq/L (ref 135–145)
Total Bilirubin: 0.9 mg/dL (ref 0.2–1.2)
Total Protein: 7.8 g/dL (ref 6.0–8.3)

## 2021-11-10 LAB — LIPID PANEL
Cholesterol: 293 mg/dL — ABNORMAL HIGH (ref 0–200)
HDL: 80.4 mg/dL (ref >39.00)
LDL Cholesterol: 182 mg/dL — ABNORMAL HIGH (ref 0–99)
NonHDL: 212.68
Total CHOL/HDL Ratio: 4
Triglycerides: 151 mg/dL — ABNORMAL HIGH (ref 0.0–149.0)
VLDL: 30.2 mg/dL (ref 0.0–40.0)

## 2021-11-10 LAB — CBC WITH DIFFERENTIAL/PLATELET
Basophils Absolute: 0.1 10*3/uL (ref 0.0–0.1)
Basophils Relative: 0.8 % (ref 0.0–3.0)
Eosinophils Absolute: 0.1 10*3/uL (ref 0.0–0.7)
Eosinophils Relative: 1.3 % (ref 0.0–5.0)
HCT: 47.5 % (ref 39.0–52.0)
Hemoglobin: 16.2 g/dL (ref 13.0–17.0)
Lymphocytes Relative: 21.1 % (ref 12.0–46.0)
Lymphs Abs: 1.7 10*3/uL (ref 0.7–4.0)
MCHC: 34.1 g/dL (ref 30.0–36.0)
MCV: 94.9 fl (ref 78.0–100.0)
Monocytes Absolute: 0.7 10*3/uL (ref 0.1–1.0)
Monocytes Relative: 8.2 % (ref 3.0–12.0)
Neutro Abs: 5.4 10*3/uL (ref 1.4–7.7)
Neutrophils Relative %: 68.6 % (ref 43.0–77.0)
Platelets: 245 10*3/uL (ref 150.0–400.0)
RBC: 5.01 Mil/uL (ref 4.22–5.81)
RDW: 13.3 % (ref 11.5–15.5)
WBC: 7.9 10*3/uL (ref 4.0–10.5)

## 2021-11-10 LAB — TSH: TSH: 1.88 u[IU]/mL (ref 0.35–5.50)

## 2021-11-10 MED ORDER — AMLODIPINE BESYLATE 5 MG PO TABS: 5.0000 mg | ORAL_TABLET | Freq: Every day | ORAL | 3 refills | Status: DC

## 2021-11-10 MED ORDER — ESCITALOPRAM OXALATE 10 MG PO TABS: 10.0000 mg | ORAL_TABLET | Freq: Every day | ORAL | 3 refills | Status: DC

## 2021-11-10 MED ORDER — HYDROCHLOROTHIAZIDE 12.5 MG PO TABS: 12.5000 mg | ORAL_TABLET | Freq: Every day | ORAL | 3 refills | Status: DC

## 2021-11-10 NOTE — Patient Instructions (Addendum)
blood pressure mild poorly controlled plus having cough (vasartan does have 3% chance of cough listed and he noted cough starting sometime after valsartan). We opted to stop his valsartan 80mg  and start hydrochlorothiazide 12.5 mg. He will update me in 2 weeks with home blood pressure though we discussed cough may take 4-6 weeks to resolve.  ?-we did discuss with weight gain could be related to reflux as well and would consider medicine for that as next step if not resolved ? ?Please stop by lab before you go ?If you have mychart- we will send your results within 3 business days of [place] receiving them.  ?If you do not have mychart- we will call you about results within 5 business days of [place] receiving them.  ?*please also note that you will see labs on mychart as soon as they post. I will later go in and write notes on them- will say "notes from Dr. Yong Channel"  ? ?Recommended follow up: Return in about 6 months (around 05/13/2022) for follow up- or sooner if needed. ?

## 2021-11-10 NOTE — Assessment & Plan Note (Signed)
S: medication: Amlodipine 5 mg (swelling on 10mg ) valsartan 80 mg though she is concerned about dry cough on valsartan ?Home readings #s: usually in 135-140 range ?BP Readings from Last 3 Encounters:  ?11/10/21 140/82  ?08/16/21 118/74  ?01/04/21 135/72  ?A/P: blood pressure mild poorly controlled plus having cough (vasartan does have 3% chance of cough listed and he noted cough starting sometime after valsartan). We opted to stop his valsartan 80mg  and start hydrochlorothiazide 12.5 mg. He will update me in 2 weeks with home blood pressure though we discussed cough may take 4-6 weeks to resolve.  ?-we did discuss with weight gain could be related to reflux as well and would consider medicine for that as next step if not resolved ?

## 2021-11-10 NOTE — Progress Notes (Signed)
?  Phone [phone] ?In person visit ?  ?Subjective:  ? ?Robert Ashley is a 41 y.o. year old very pleasant male patient who presents for/with See problem oriented charting ? ?This visit occurred during the SARS-CoV-2 public health emergency.  Safety protocols were in place, including screening questions prior to the visit, additional usage of staff PPE, and extensive cleaning of exam room while observing appropriate contact time as indicated for disinfecting solutions.  ? ?Past Medical History-  ?Patient Active Problem List  ? Diagnosis Date Noted  ? GAD (generalized anxiety disorder) 08/24/2018  ?  Priority: Medium   ? Essential hypertension   ?  Priority: Medium   ? Flu-like symptoms 07/07/2021  ? Syncope and collapse 01/04/2021  ? Marijuana use 01/04/2021  ? ADHD (attention deficit hyperactivity disorder), combined type 12/19/2018  ? GERD (gastroesophageal reflux disease)   ? ?Medications- reviewed and updated ?Current Outpatient Medications  ?Medication Sig Dispense Refill  ? amLODipine (NORVASC) 5 MG tablet TAKE ONE TABLET BY MOUTH DAILY 30 tablet 5  ? escitalopram (LEXAPRO) 10 MG tablet TAKE ONE TABLET BY MOUTH DAILY 30 tablet 5  ? hydrochlorothiazide (HYDRODIURIL) 12.5 MG tablet Take 1 tablet (12.5 mg total) by mouth daily. 90 tablet 3  ? Multiple Vitamin (MULTIVITAMIN WITH MINERALS) TABS tablet Take 1 tablet by mouth daily.    ? trimethoprim-polymyxin b (POLYTRIM) ophthalmic solution Place 1 drop into both eyes every 6 (six) hours. 10 mL 0  ? ?No current facility-administered medications for this visit.  ? ?  ?Objective:  ?BP 140/82   Pulse 75   Temp 98.2 ?F (36.8 ?C)   Ht 6\' 3"  (1.905 m)   Wt 230 lb 9.6 oz (104.6 kg)   SpO2 98%   BMI 28.82 kg/m?  ? ?  ? ?Assessment and Plan  ? ?Essential hypertension ?S: medication: Amlodipine 5 mg (swelling on 10mg ) valsartan 80 mg though she is concerned about dry cough on valsartan ?Home readings #s: usually in 135-140 range ?BP Readings from Last 3 Encounters:   ?11/10/21 140/82  ?08/16/21 118/74  ?01/04/21 135/72  ?A/P: blood pressure mild poorly controlled plus having cough (vasartan does have 3% chance of cough listed and he noted cough starting sometime after valsartan). We opted to stop his valsartan 80mg  and start hydrochlorothiazide 12.5 mg. He will update me in 2 weeks with home blood pressure though we discussed cough may take 4-6 weeks to resolve.  ?-we did discuss with weight gain could be related to reflux as well and would consider medicine for that as next step if not resolved ? ?Recommended follow up: update by mychart and otherwise at least every 6 months ? ?Lab/Order associations: ?  ICD-10-CM   ?1. Preventative health care  Z00.00   ?  ?2. Essential hypertension  I10 CBC with Differential/Platelet  ?  Comprehensive metabolic panel  ?  Lipid panel  ?  ?3. GAD (generalized anxiety disorder)  F41.1   ?  ?4. Atypical chest pain  R07.89   ?  ?5. Hyperlipidemia, unspecified hyperlipidemia type  E78.5 CBC with Differential/Platelet  ?  Comprehensive metabolic panel  ?  Lipid panel  ?  TSH  ?  ? ? ?Meds ordered this encounter  ?Medications  ? hydrochlorothiazide (HYDRODIURIL) 12.5 MG tablet  ?  Sig: Take 1 tablet (12.5 mg total) by mouth daily.  ?  Dispense:  90 tablet  ?  Refill:  3  ? ? ?Return precautions advised.  ?03/06/21, MD ? ? ?

## 2022-01-01 ENCOUNTER — Encounter: Payer: Self-pay | Admitting: Family Medicine

## 2022-01-03 ENCOUNTER — Encounter: Payer: Self-pay | Admitting: Family Medicine

## 2022-01-04 MED ORDER — HYDROCHLOROTHIAZIDE 25 MG PO TABS: 25.0000 mg | ORAL_TABLET | Freq: Every day | ORAL | 3 refills | Status: DC

## 2022-03-05 IMAGING — CT CT HEAD W/O CM
3 series · 14 of 47 positions shown, 16 images · non-contrast
Comparison: None.

CLINICAL DATA: Neurologic deficit

EXAM:
CT HEAD WITHOUT CONTRAST
TECHNIQUE: Contiguous axial images were obtained from the base of the skull
through the vertex without intravenous contrast.

[Series 3: head 5.0 h30s · axial · 0.49mm/px · z∈[-108,+22]mm · 8 of 32 slices shown, 10 images]
[im 3/32  brain]
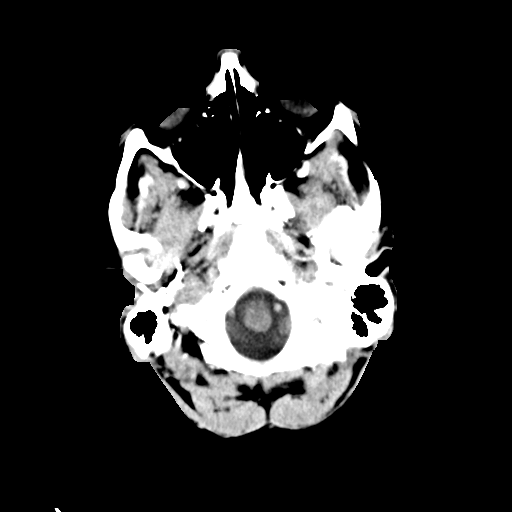
[im 3/32  bone]
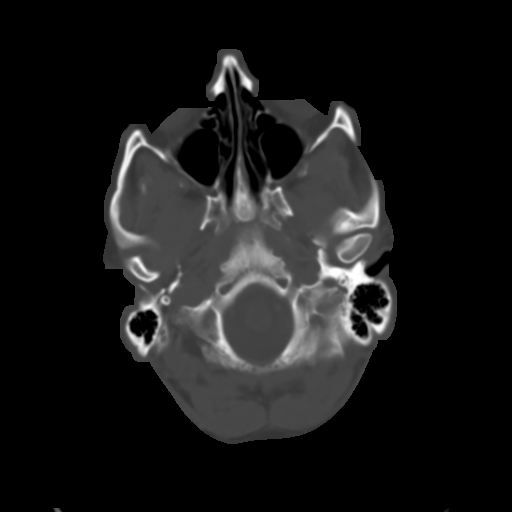
[im 7/32  brain]
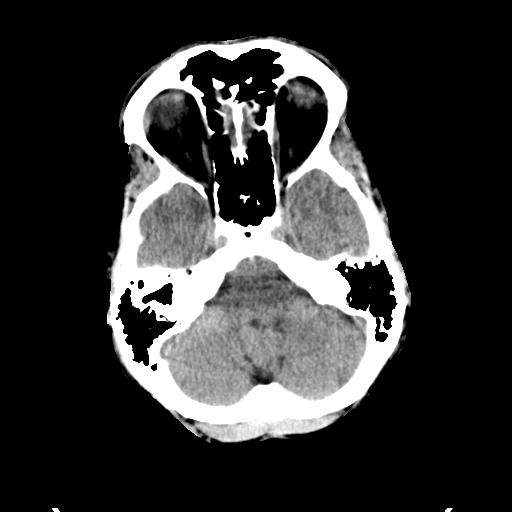
[im 10/32  brain]
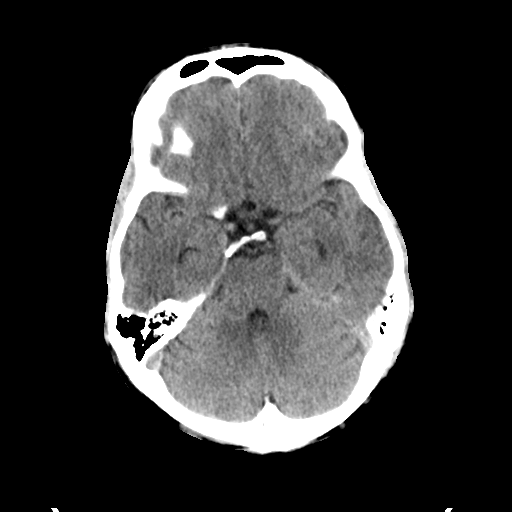
[im 14/32  brain]
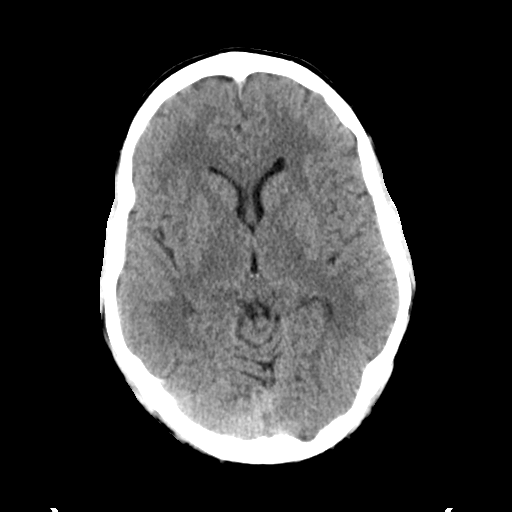
[im 18/32  brain]
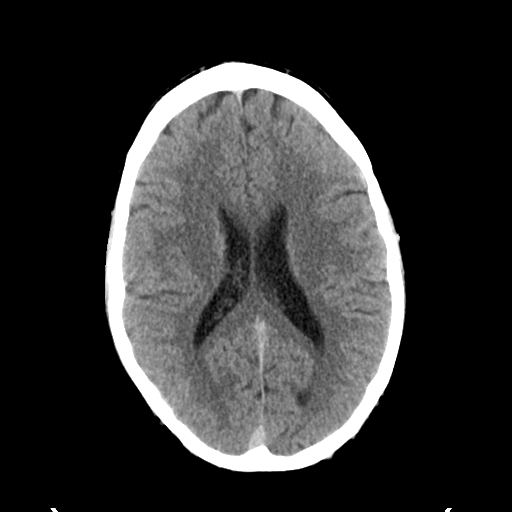
[im 18/32  bone]
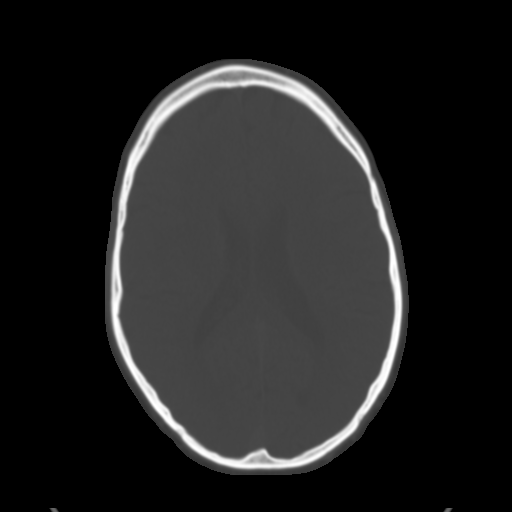
[im 22/32  brain]
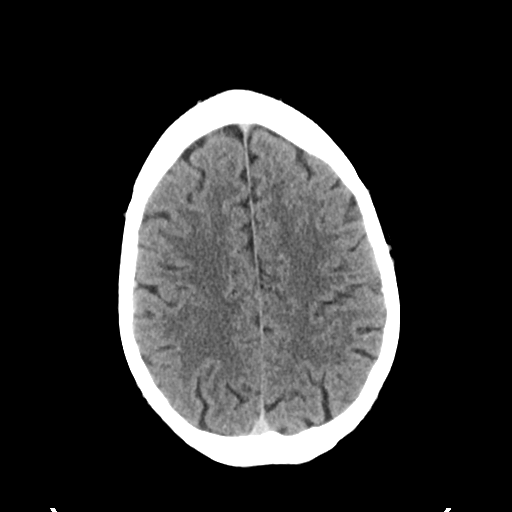
[im 25/32  brain]
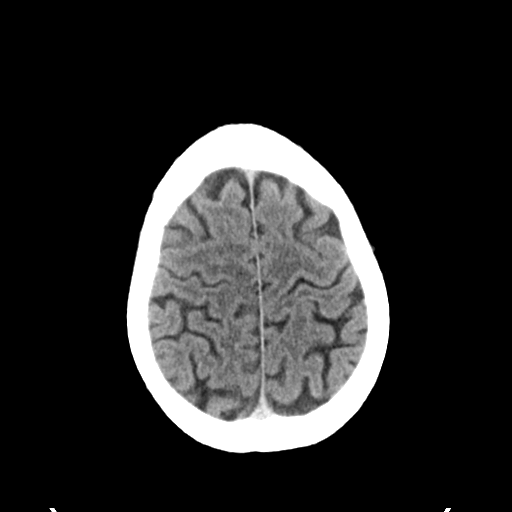
[im 29/32  brain]
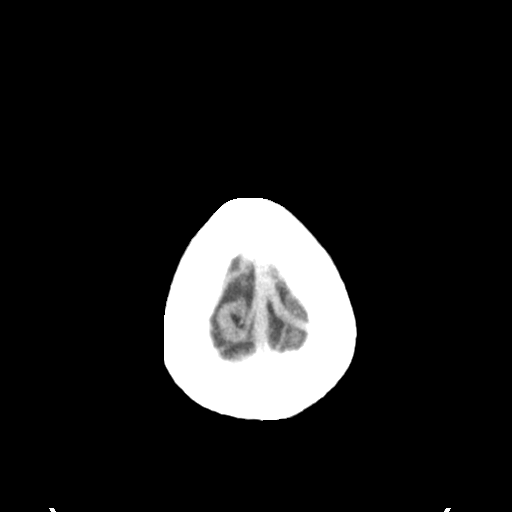

[Series 5: head 3.0 mpr cor · coronal · 0.31mm/px · 3 of 73 slices shown]
[im 25/73  brain]
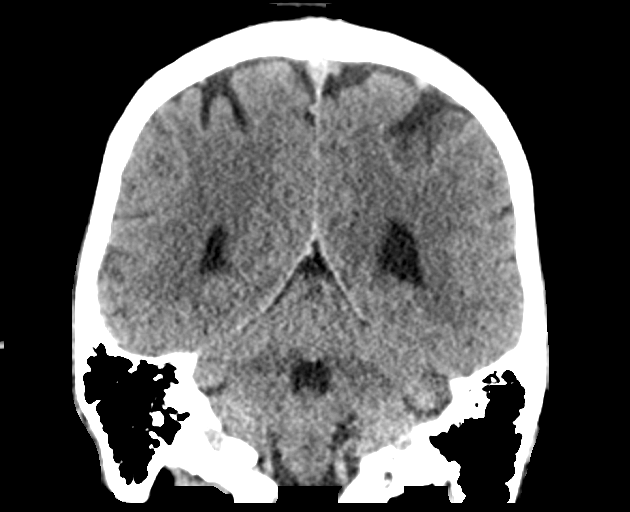
[im 33/73  brain]
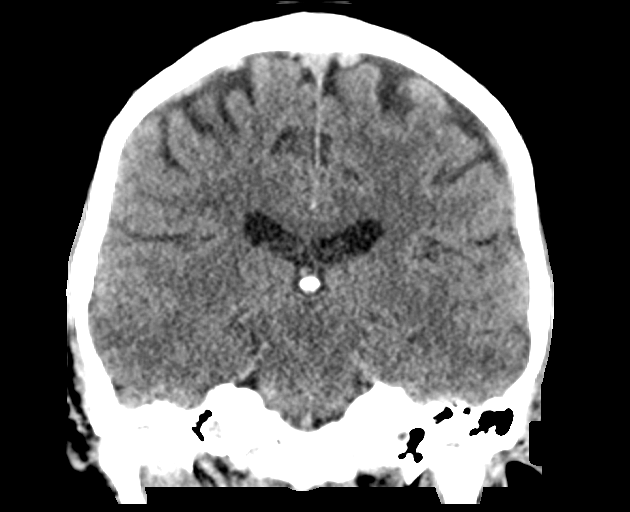
[im 41/73  brain]
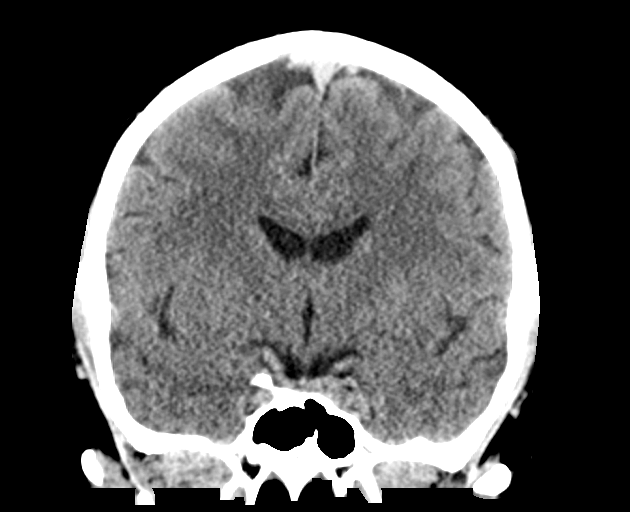

[Series 6: head 3.0 mpr sag · sagittal · 0.32mm/px · 3 of 67 slices shown]
[im 23/67  brain]
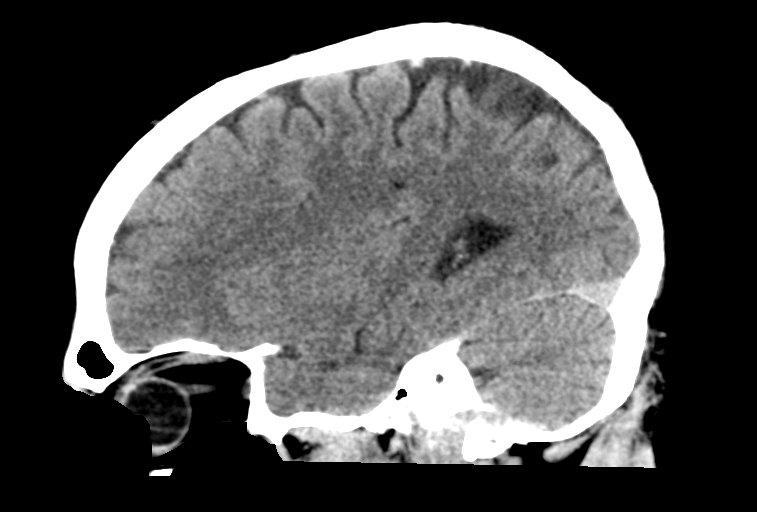
[im 34/67  brain]
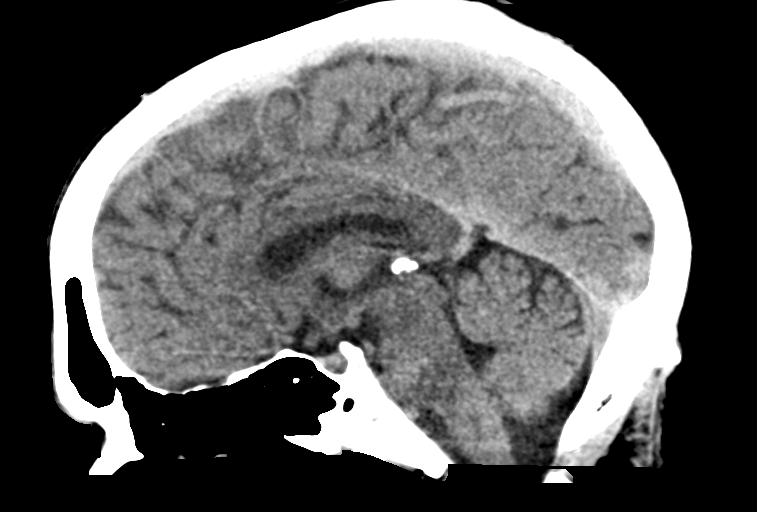
[im 45/67  brain]
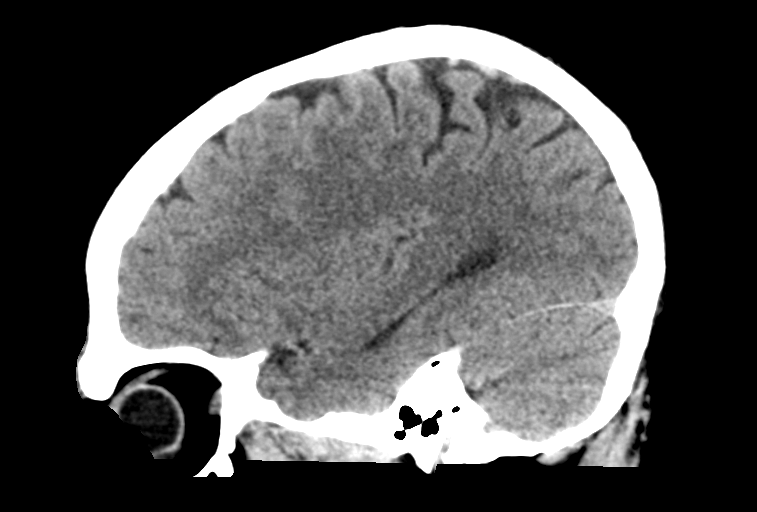

[14 of 47 positions shown; findings below may reference images not displayed]

FINDINGS: Brain: No evidence of large territory acute infarction, hemorrhage,
hydrocephalus, extra-axial collection or mass lesion/mass effect.
There is a small focus of low-density change within the right
periventricular white matter (series 3, image 20). Ventricular size
and configuration is normal.

Vascular: No hyperdense vessel or unexpected calcification.

Skull: Normal. Negative for fracture or focal lesion.

Sinuses/Orbits: No acute finding.

Other: None.
IMPRESSION: 1. Small focus of low-density change within the right
periventricular white matter which may represent chronic
microvascular ischemic change, focal demyelination, versus a small
age-indeterminate lacunar infarct. Further evaluation with MRI of
the brain is recommended.
2. Appearance of the brain is otherwise within normal limits.

## 2022-03-05 IMAGING — MR MR HEAD W/O CM
9 of 10 series · 38 of 48 positions shown · non-contrast
Comparison: Head CT earlier today.

CLINICAL DATA: 39-year-old male with right facial numbness onset
this morning, left arm numbness although began a few days ago. Small
nonspecific right cerebral white matter lesion on head CT earlier
today.

EXAM:
MRI HEAD WITHOUT CONTRAST
TECHNIQUE: Multiplanar, multiecho pulse sequences of the brain and surrounding
structures were obtained without intravenous contrast.

[Series 3: DWI · axial · 3.0mm · 1.09mm/px · z∈[-53,+111]mm · 11 of 112 slices shown (1 of 4)]
[im 1/112]
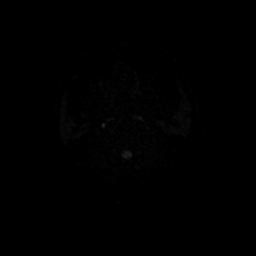
[im 12/112]
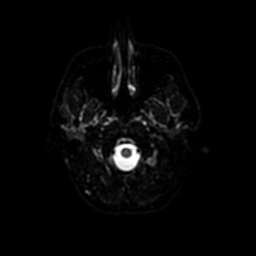
[im 23/112]
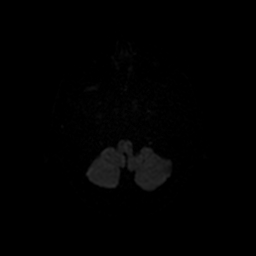
[im 34/112]
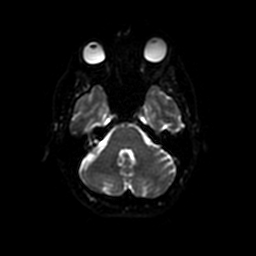
[im 45/112]
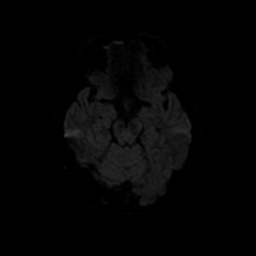
[im 56/112]
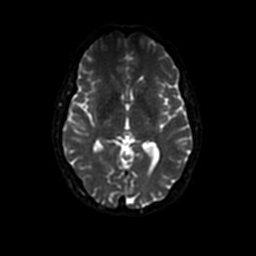
[im 67/112]
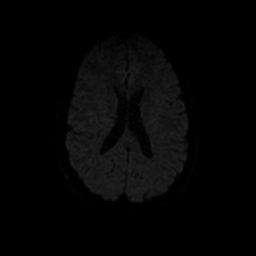
[im 78/112]
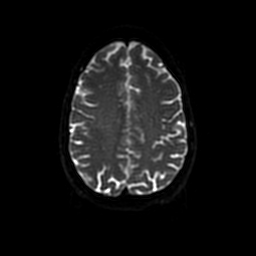
[im 89/112]
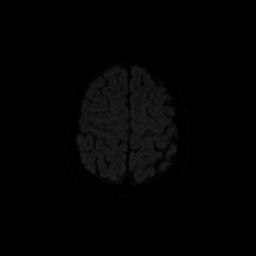
[im 100/112]
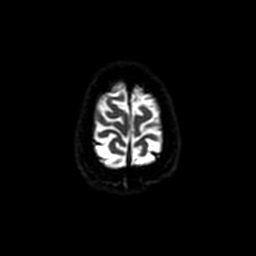
[im 112/112]
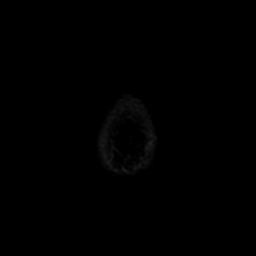

[Series 4: DWI · coronal · 5.0mm · 1.09mm/px · 8 of 86 slices shown (2 of 4)]
[im 1/86]
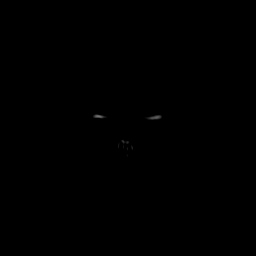
[im 13/86]
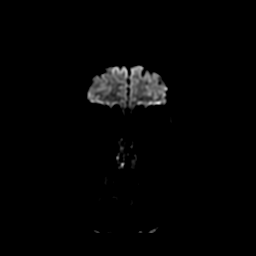
[im 25/86]
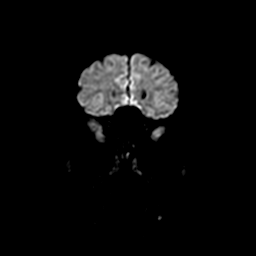
[im 37/86]
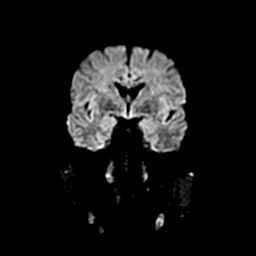
[im 49/86]
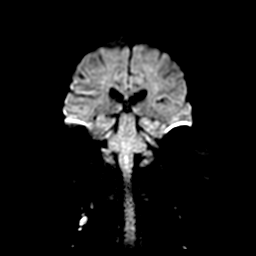
[im 61/86]
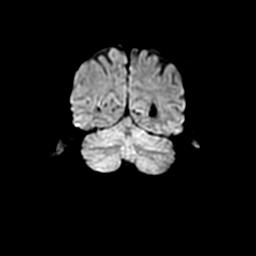
[im 73/86]
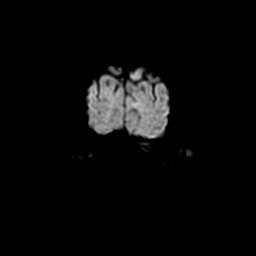
[im 86/86]
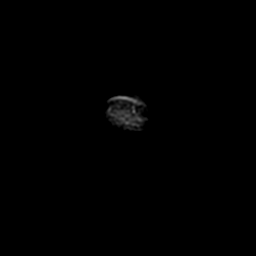

[Series 5: T1 · sagittal · 5.0mm · 0.47mm/px · 2 of 22 slices shown (1 of 2)]
[im 1/22]
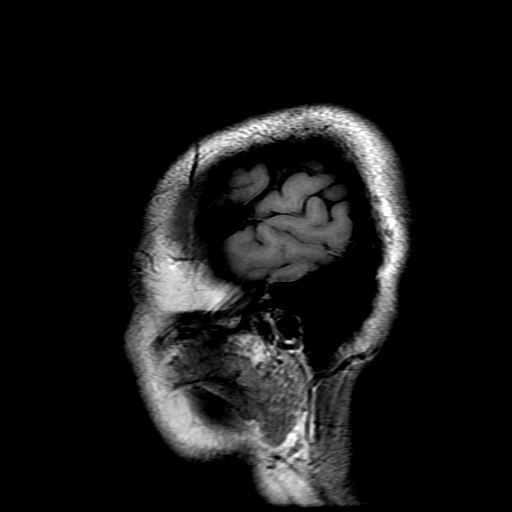
[im 22/22]
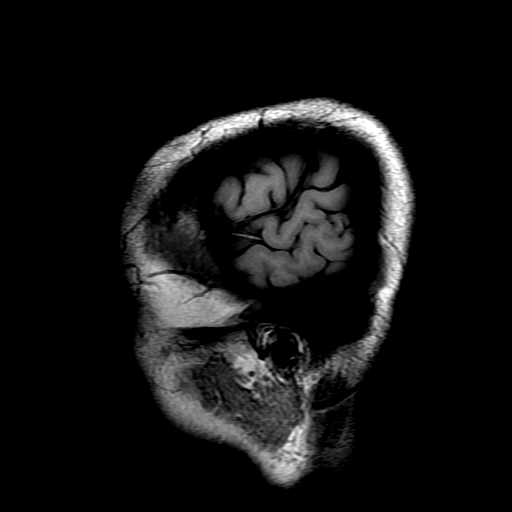

[Series 6: T2 · axial · 5.0mm · 0.43mm/px · z∈[-43,+107]mm · 2 of 26 slices shown (1 of 2)]
[im 1/26]
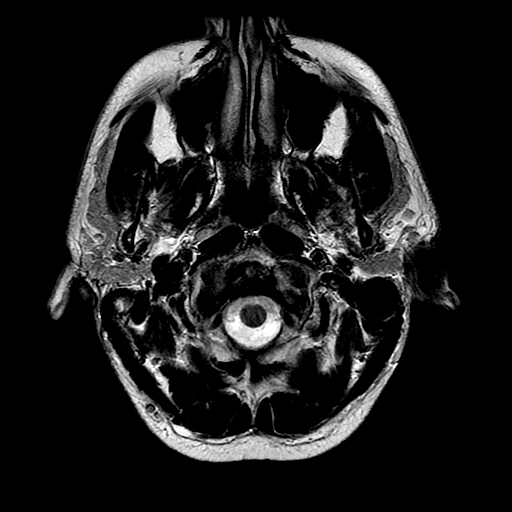
[im 26/26]
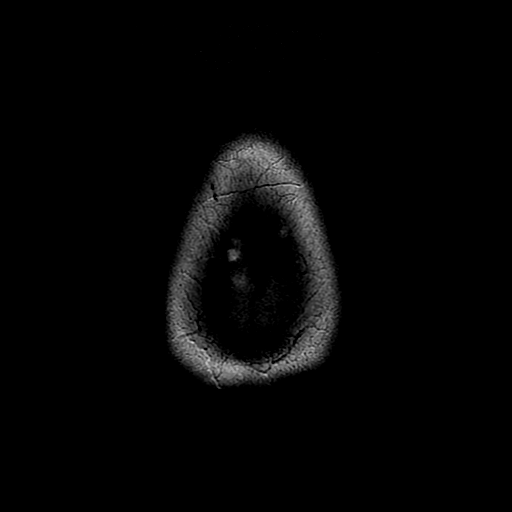

[Series 7: FLAIR · axial · 3.0mm · 0.43mm/px · z∈[-43,+107]mm · 2 of 26 slices shown]
[im 1/26]
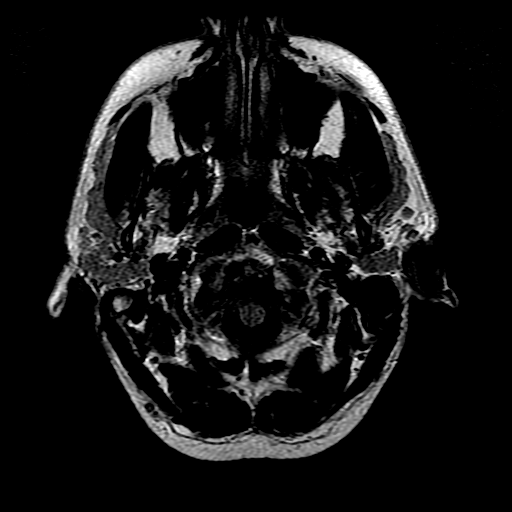
[im 26/26]
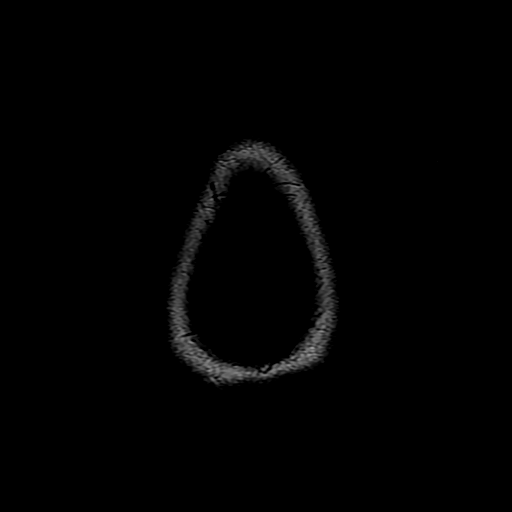

[Series 9: T1 · axial · 3.0mm · 0.47mm/px · z∈[-44,-28]mm · 2 of 104 slices shown (2 of 2)]
[im 1/104]
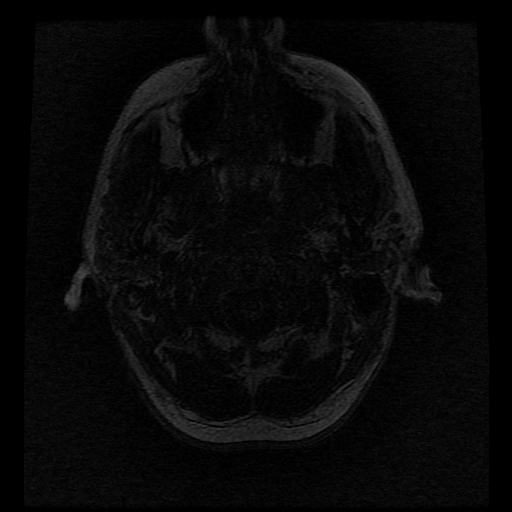
[im 12/104]
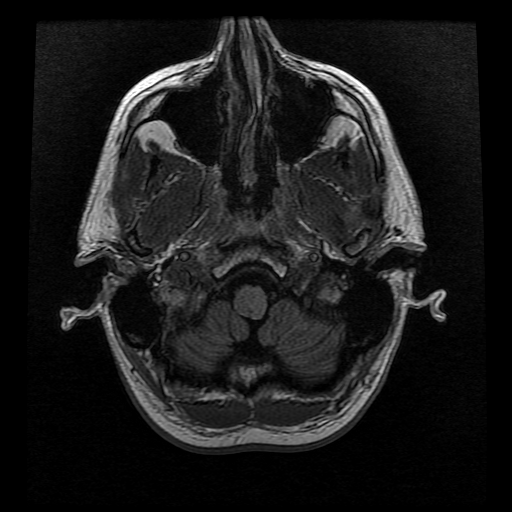

[Series 10: T2 · coronal · 5.0mm · 0.39mm/px · 2 of 25 slices shown (2 of 2)]
[im 1/25]
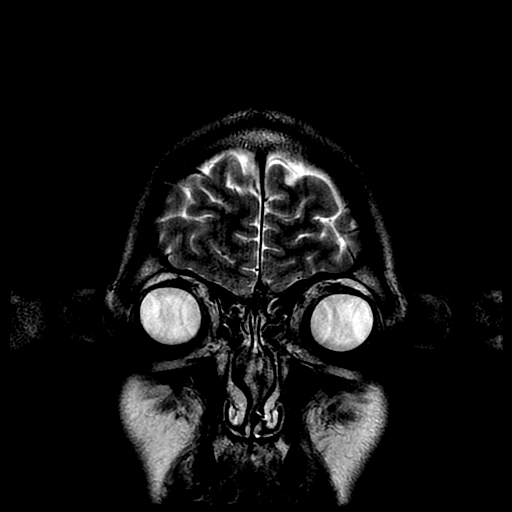
[im 25/25]
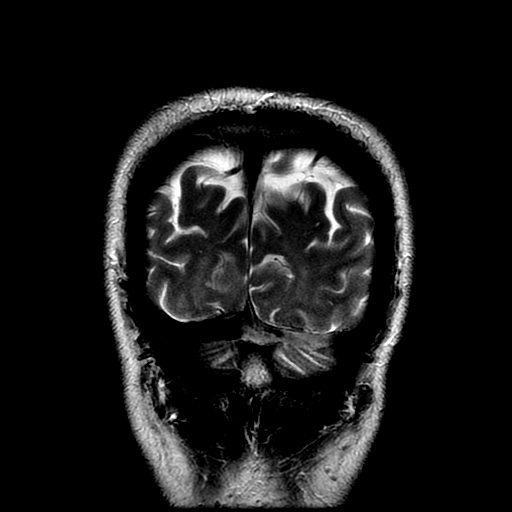

[Series 300: DWI · axial · 3.0mm · 1.09mm/px · z∈[-53,+111]mm · 5 of 56 slices shown (3 of 4)]
[im 1/56]
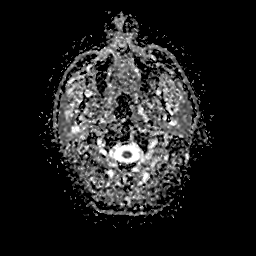
[im 14/56]
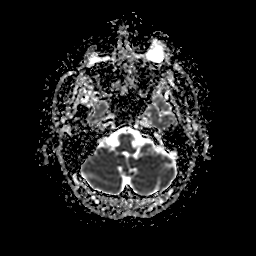
[im 28/56]
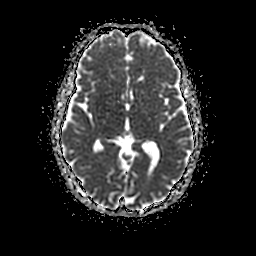
[im 42/56]
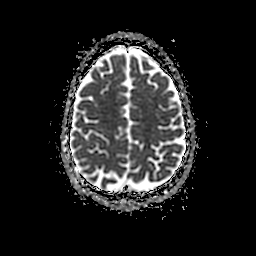
[im 56/56]
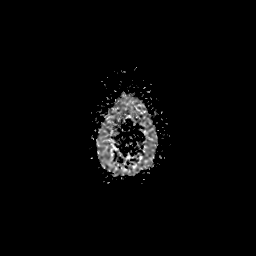

[Series 400: DWI · coronal · 5.0mm · 1.09mm/px · 4 of 43 slices shown (4 of 4)]
[im 1/43]
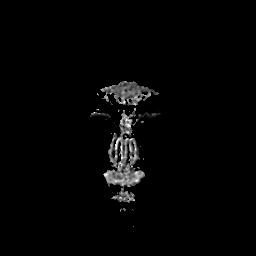
[im 15/43]
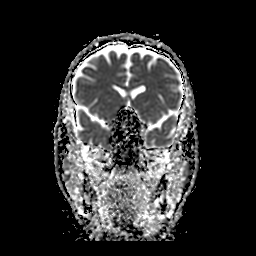
[im 29/43]
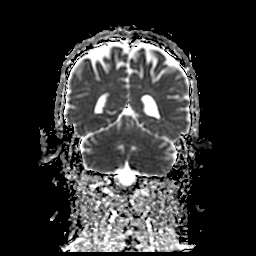
[im 43/43]
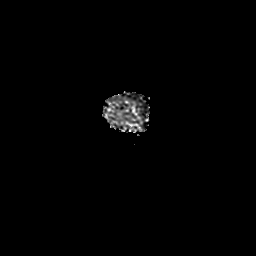

[38 of 48 positions shown; findings below may reference images not displayed]

FINDINGS: Brain: No restricted diffusion to suggest acute infarction. No
midline shift, mass effect, evidence of mass lesion,
ventriculomegaly, extra-axial collection or acute intracranial
hemorrhage. Cervicomedullary junction and pituitary are within
normal limits.

There is a dilated perivascular space (normal variant) of the right
corona radiata (series 10, image 10) corresponding to the CT finding
earlier today. Gray and white matter signal is within normal limits
throughout the brain. No convincing encephalomalacia. No chronic
cerebral blood products identified. Deep gray nuclei, brainstem and
cerebellum appear normal.

Vascular: Major intracranial vascular flow voids are preserved.

Skull and upper cervical spine: Negative.

Sinuses/Orbits: Negative.

Other: Visible internal auditory structures appear normal. Scalp and
face appear negative.
IMPRESSION: No acute intracranial abnormality.

Normal noncontrast MRI appearance of the brain, small incidental
perivascular space corresponds to the CT finding earlier today.

## 2022-03-05 IMAGING — CR DG CHEST 2V
2 series · 2 of 2 positions shown · non-contrast
Comparison: None.

CLINICAL DATA: Left-sided numbness

EXAM:
CHEST - 2 VIEW

[chest pa]
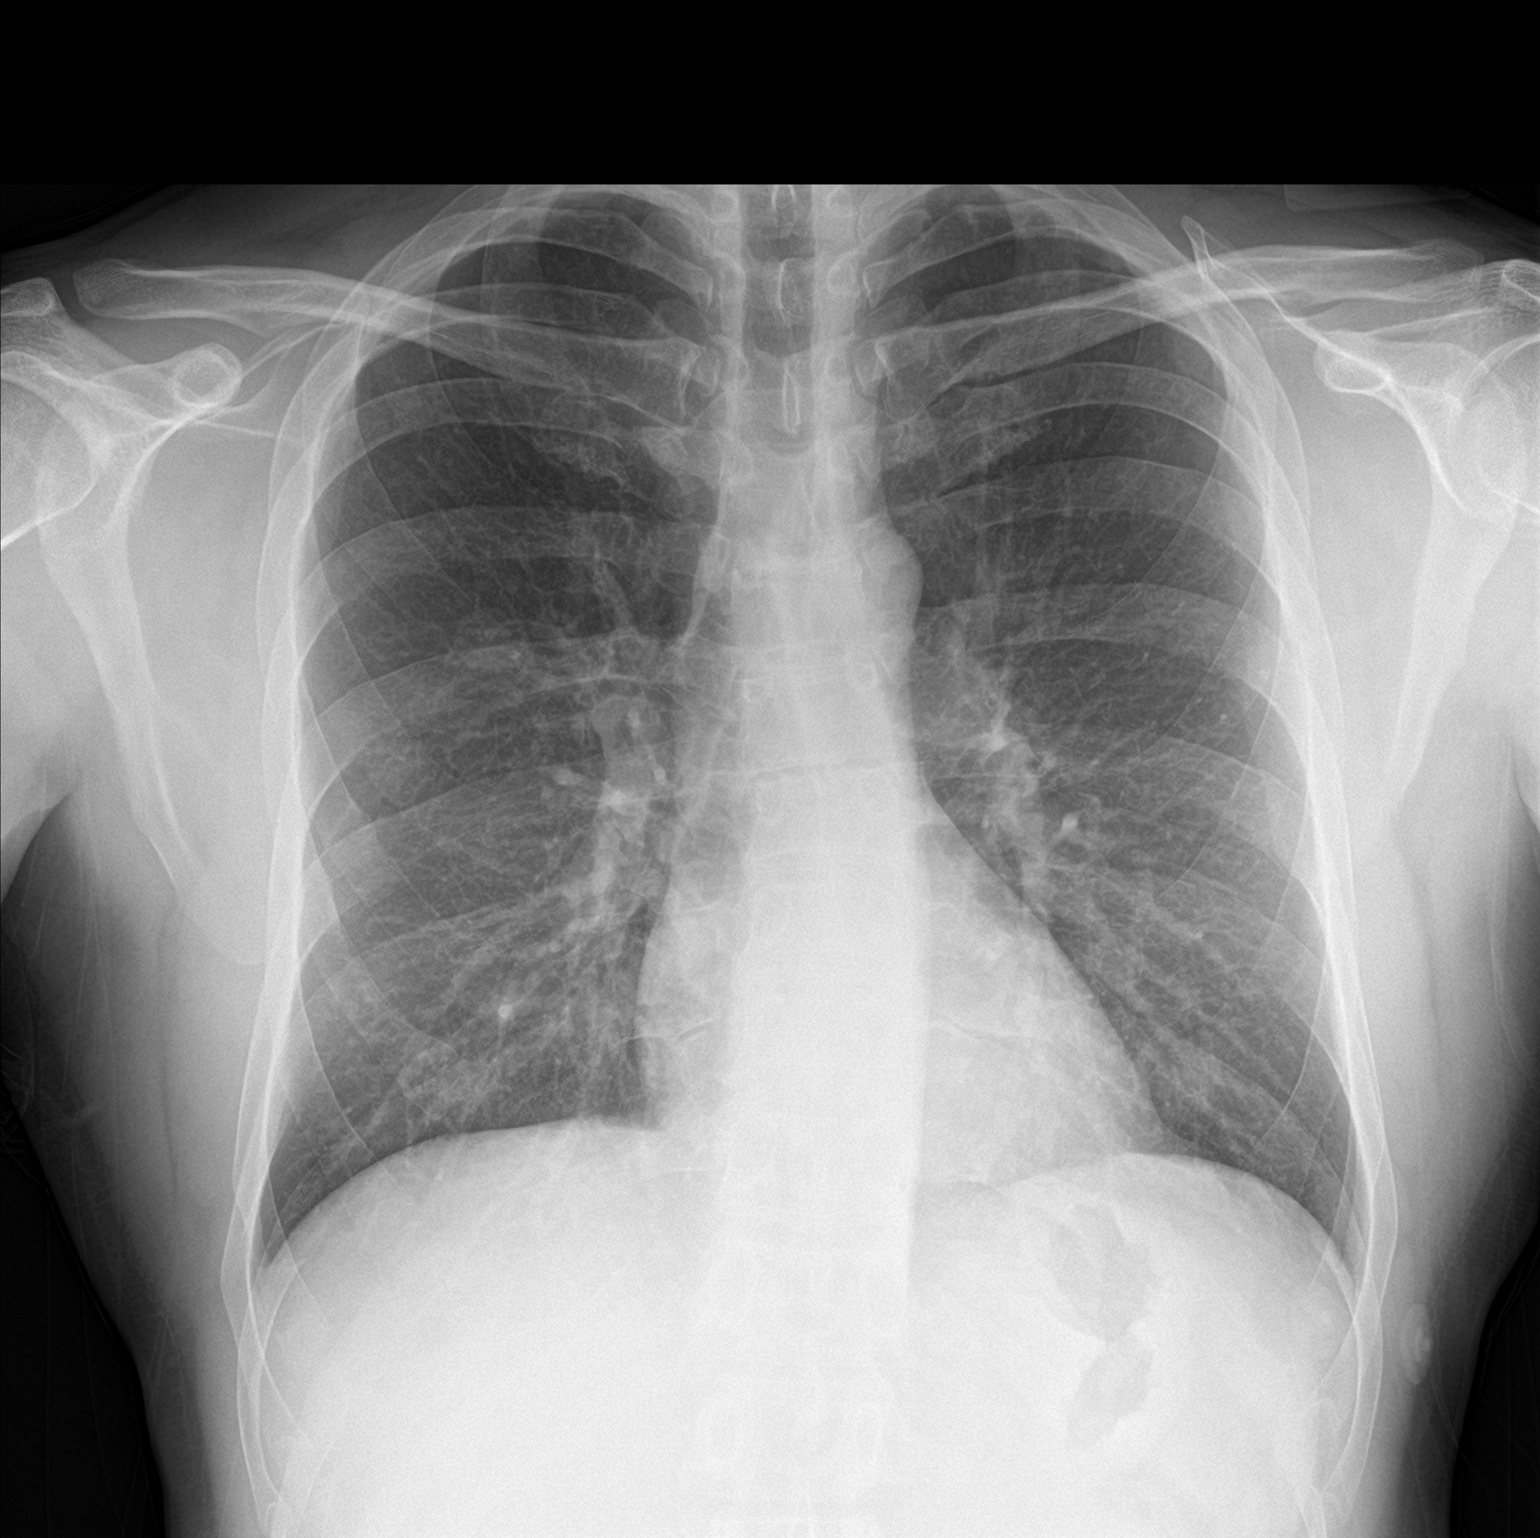

[chest lat]
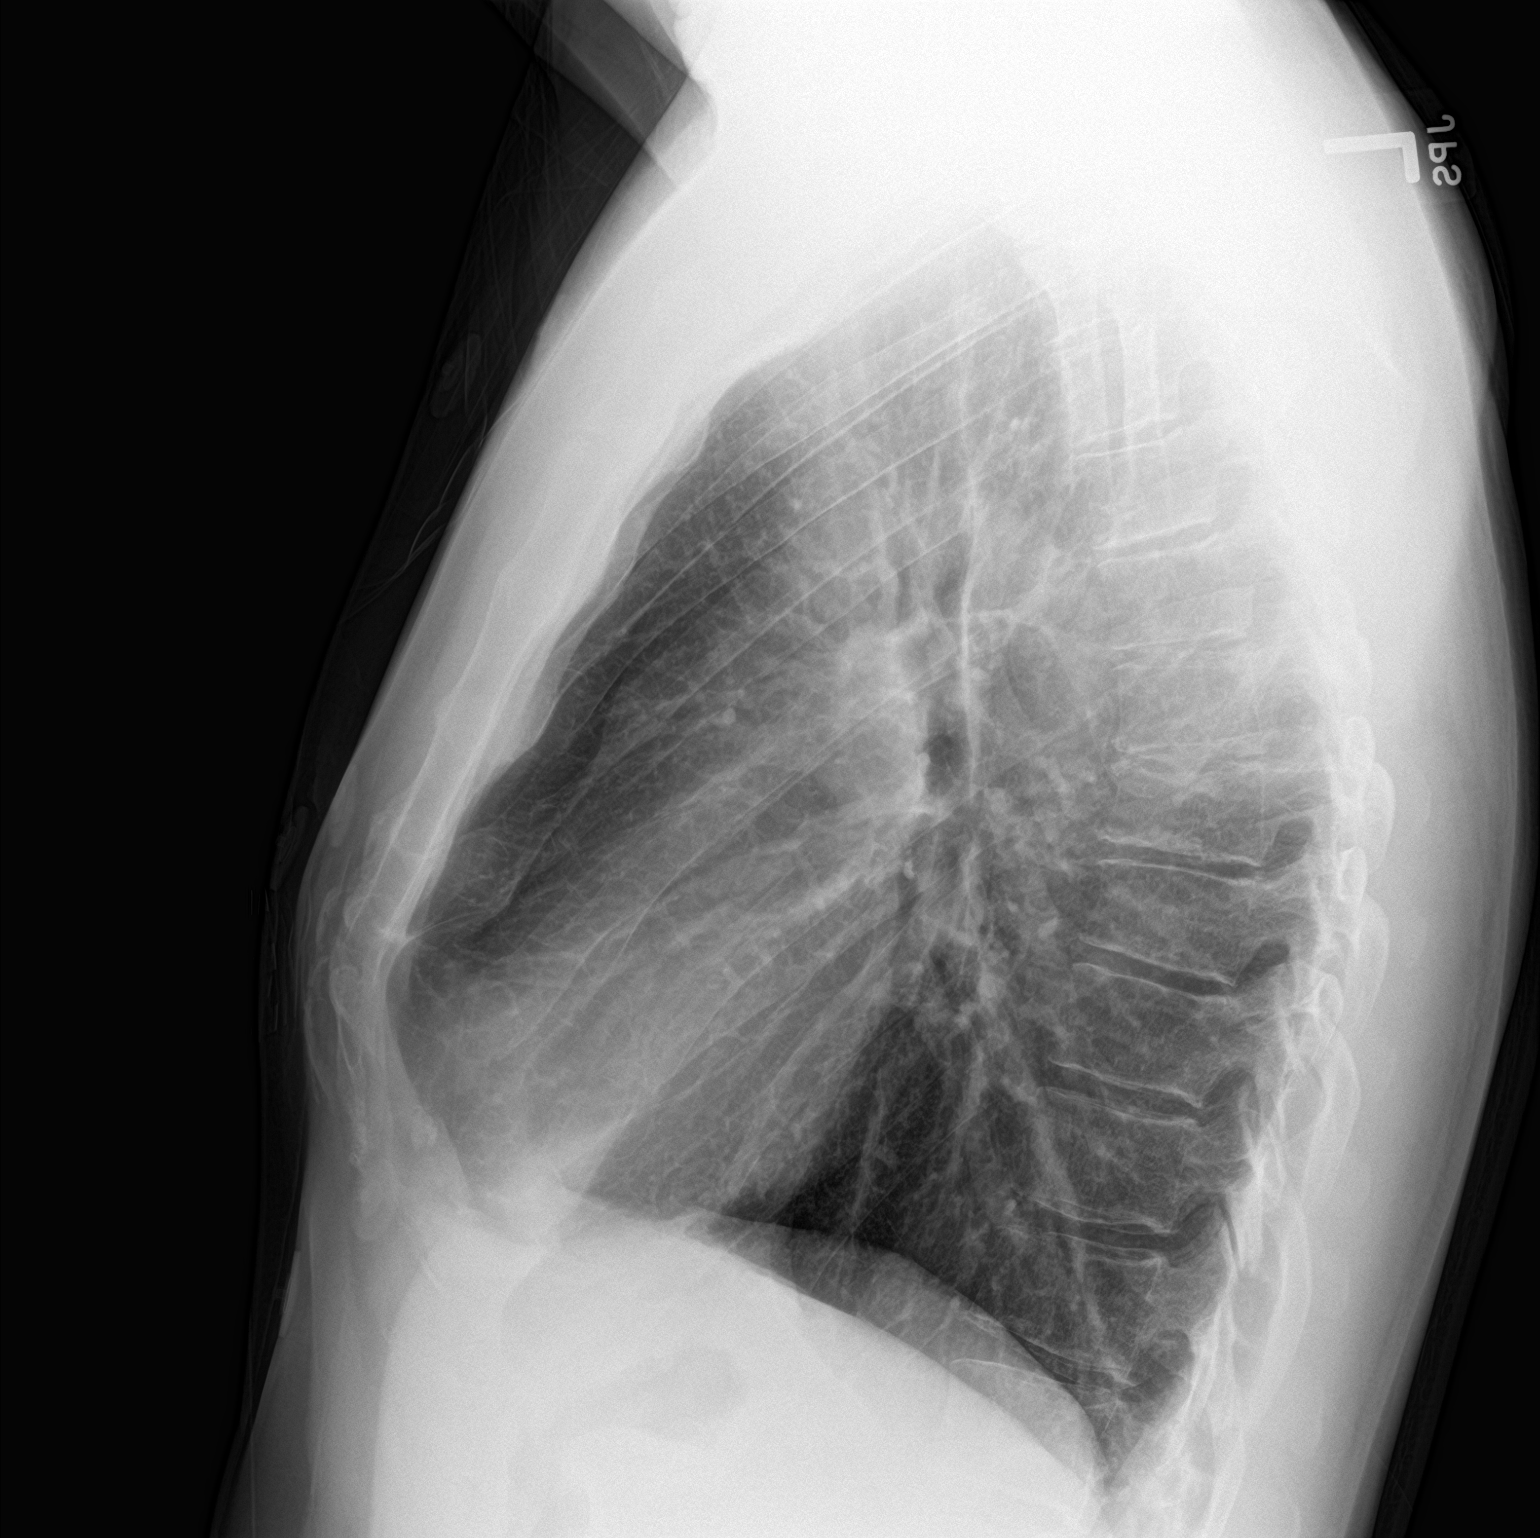

[2 of 2 positions shown; findings below may reference images not displayed]

FINDINGS: The heart size and mediastinal contours are within normal limits.
Both lungs are clear. The visualized skeletal structures are
unremarkable.
IMPRESSION: No active cardiopulmonary disease.

## 2022-05-16 ENCOUNTER — Encounter: Payer: Self-pay | Admitting: Family Medicine

## 2022-05-16 ENCOUNTER — Ambulatory Visit (INDEPENDENT_AMBULATORY_CARE_PROVIDER_SITE_OTHER): Payer: No Typology Code available for payment source | Admitting: Family Medicine

## 2022-05-16 VITALS — BP 120/70 | HR 71 | Temp 98.0°F | Ht 75.0 in | Wt 220.4 lb

## 2022-05-16 DIAGNOSIS — I10 Essential (primary) hypertension: Secondary | ICD-10-CM | POA: Diagnosis not present

## 2022-05-16 DIAGNOSIS — F411 Generalized anxiety disorder: Secondary | ICD-10-CM | POA: Diagnosis not present

## 2022-05-16 LAB — COMPREHENSIVE METABOLIC PANEL
ALT: 24 U/L (ref 0–53)
AST: 26 U/L (ref 0–37)
Albumin: 4.3 g/dL (ref 3.5–5.2)
Alkaline Phosphatase: 74 U/L (ref 39–117)
BUN: 12 mg/dL (ref 6–23)
CO2: 31 mEq/L (ref 19–32)
Calcium: 9.7 mg/dL (ref 8.4–10.5)
Chloride: 98 mEq/L (ref 96–112)
Creatinine, Ser: 1.14 mg/dL (ref 0.40–1.50)
GFR: 80.59 mL/min (ref >60.00)
Glucose, Bld: 96 mg/dL (ref 70–99)
Potassium: 3.4 mEq/L — ABNORMAL LOW (ref 3.5–5.1)
Sodium: 135 mEq/L (ref 135–145)
Total Bilirubin: 1 mg/dL (ref 0.2–1.2)
Total Protein: 7.6 g/dL (ref 6.0–8.3)

## 2022-05-16 NOTE — Patient Instructions (Addendum)
Please stop by lab before you go- checking liver If you have mychart- we will send your results within 3 business days of [place] receiving them.  If you do not have mychart- we will call you about results within 5 business days of [place] receiving them.  *please also note that you will see labs on mychart as soon as they post. I will later go in and write notes on them- will say "notes from Dr. Durene Cal"   - with weight loss (great job!)- we discussed if blood pressure at home getting down to 110 or so regularly potentially cutting hydrochlorothiazide in half  Recommended follow up: Return in about 6 months (around 11/14/2022) for physical or sooner if needed.Schedule b4 you leave.

## 2022-05-16 NOTE — Progress Notes (Signed)
  Phone [phone] In person visit   Subjective:   Robert Ashley is a 41 y.o. year old very pleasant male patient who presents for/with See problem oriented charting Chief Complaint  Patient presents with   Follow-up   Hypertension    Past Medical History-  Patient Active Problem List   Diagnosis Date Noted   GAD (generalized anxiety disorder) 08/24/2018    Priority: Medium    Essential hypertension     Priority: Medium    Marijuana use 01/04/2021    Priority: Low   GERD (gastroesophageal reflux disease)     Priority: Low   Syncope and collapse 01/04/2021   ADHD (attention deficit hyperactivity disorder), combined type 12/19/2018    Medications- reviewed and updated Current Outpatient Medications  Medication Sig Dispense Refill   amLODipine (NORVASC) 5 MG tablet Take 1 tablet (5 mg total) by mouth daily. 90 tablet 3   escitalopram (LEXAPRO) 10 MG tablet Take 1 tablet (10 mg total) by mouth daily. 90 tablet 3   hydrochlorothiazide (HYDRODIURIL) 25 MG tablet Take 1 tablet (25 mg total) by mouth daily. 90 tablet 3   Multiple Vitamin (MULTIVITAMIN WITH MINERALS) TABS tablet Take 1 tablet by mouth daily.     No current facility-administered medications for this visit.     Objective:  BP 120/70   Pulse 71   Temp 98 F (36.7 C)   Ht 6\' 3"  (1.905 m)   Wt 220 lb 6.4 oz (100 kg)   SpO2 99%   BMI 27.55 kg/m  Gen: NAD, resting comfortably CV: RRR no murmurs rubs or gallops Lungs: CTAB no crackles, wheeze, rhonchi Ext: no edema Skin: warm, dry    Assessment and Plan   #hypertension S: medication: Amlodipine 5 mg, hydrochlorothiazide 25 mg -Cough on valsartan in the past (3% chance of cough)- went away thankfully Home readings #s: similar readings maybe 125/high 70s BP Readings from Last 3 Encounters:  05/16/22 120/70  11/10/21 140/82  08/16/21 118/74  A/P: Controlled. Continue current medications.  - with weight loss- we discussed if blood pressure at home  getting down to 110 or so regularly potentially cutting hydrochlorothiazide in half  #hyperlipidemia S: Medication:None-ASCVD risk not significantly elevated but has had rather significant LDL elevations in 2023 -CT cardiac scoring of 0 on 01/28/2021, has also seen cardiology in May 2022 (stopped marijuana after this)  A/P: lipids elevated but wants to continue to work on diet/exercise and recheck at physical   # Anxiety S:Medication: Escitalopram 10 mg. No SI A/P: doing very well- continue current meds   #Prior mild LFT elevations- has nearly cut out alcohol for about 3 months (had one period 2 months none). Down 10 lbs. Has increased exercise some.  Lab Results  Component Value Date   ALT 83 (H) 11/10/2021   AST 66 (H) 11/10/2021   ALKPHOS 90 11/10/2021   BILITOT 0.9 11/10/2021   Recommended follow up: Return in about 6 months (around 11/14/2022) for physical or sooner if needed.Schedule b4 you leave.  Lab/Order associations:   ICD-10-CM   1. Essential hypertension  I10 Comprehensive metabolic panel    2. GAD (generalized anxiety disorder)  F41.1      Return precautions advised.  01/14/2023, MD

## 2022-11-15 ENCOUNTER — Other Ambulatory Visit: Payer: Self-pay | Admitting: Family Medicine

## 2022-12-10 ENCOUNTER — Other Ambulatory Visit: Payer: Self-pay | Admitting: Family Medicine

## 2023-01-02 ENCOUNTER — Other Ambulatory Visit: Payer: Self-pay | Admitting: Family Medicine

## 2023-02-05 ENCOUNTER — Encounter: Payer: Self-pay | Admitting: Family Medicine

## 2023-06-26 ENCOUNTER — Other Ambulatory Visit (INDEPENDENT_AMBULATORY_CARE_PROVIDER_SITE_OTHER): Payer: No Typology Code available for payment source

## 2023-06-26 ENCOUNTER — Encounter: Payer: Self-pay | Admitting: Orthopedic Surgery

## 2023-06-26 ENCOUNTER — Ambulatory Visit: Payer: No Typology Code available for payment source | Admitting: Orthopedic Surgery

## 2023-06-26 DIAGNOSIS — M25562 Pain in left knee: Secondary | ICD-10-CM
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
## 2023-07-02 ENCOUNTER — Ambulatory Visit
Admission: RE | Admit: 2023-07-02 | Discharge: 2023-07-02 | Disposition: A | Discharge status: Home / Self Care | Payer: No Typology Code available for payment source | Source: Ambulatory Visit | Attending: Orthopedic Surgery | Admitting: Orthopedic Surgery

## 2023-07-02 DIAGNOSIS — M25562 Pain in left knee: Secondary | ICD-10-CM

## 2023-08-16 ENCOUNTER — Other Ambulatory Visit (HOSPITAL_BASED_OUTPATIENT_CLINIC_OR_DEPARTMENT_OTHER): Payer: Self-pay

## 2023-08-18 ENCOUNTER — Other Ambulatory Visit: Payer: Self-pay

## 2023-08-18 ENCOUNTER — Encounter (HOSPITAL_BASED_OUTPATIENT_CLINIC_OR_DEPARTMENT_OTHER): Payer: Self-pay | Admitting: Orthopaedic Surgery

## 2023-08-21 ENCOUNTER — Encounter (HOSPITAL_BASED_OUTPATIENT_CLINIC_OR_DEPARTMENT_OTHER)
Admission: RE | Admit: 2023-08-21 | Discharge: 2023-08-21 | Disposition: A | Discharge status: Home / Self Care | Payer: PRIVATE HEALTH INSURANCE | Source: Ambulatory Visit | Attending: Orthopaedic Surgery | Admitting: Orthopaedic Surgery

## 2023-08-21 DIAGNOSIS — Z01818 Encounter for other preprocedural examination: Secondary | ICD-10-CM | POA: Diagnosis present

## 2023-08-21 LAB — BASIC METABOLIC PANEL
Anion gap: 9 (ref 5–15)
BUN: 14 mg/dL (ref 6–20)
CO2: 30 mmol/L (ref 22–32)
Calcium: 9.7 mg/dL (ref 8.9–10.3)
Chloride: 100 mmol/L (ref 98–111)
Creatinine, Ser: 1.02 mg/dL (ref 0.61–1.24)
GFR, Estimated: >60 mL/min (ref >60)
Glucose, Bld: 97 mg/dL (ref 70–99)
Potassium: 3.8 mmol/L (ref 3.5–5.1)
Sodium: 139 mmol/L (ref 135–145)

## 2023-08-21 NOTE — Progress Notes (Signed)
Surgical soap given with instructions, pt verbalized understanding.  

## 2023-08-22 ENCOUNTER — Other Ambulatory Visit (HOSPITAL_BASED_OUTPATIENT_CLINIC_OR_DEPARTMENT_OTHER): Payer: Self-pay

## 2023-08-22 MED ORDER — ASPIRIN 81 MG PO CHEW
81.0000 mg | CHEWABLE_TABLET | Freq: Two times a day (BID) | ORAL | 0 refills | Status: DC
  Filled 2023-08-22: qty 84, 42d supply, fill #0

## 2023-08-22 MED ORDER — MELOXICAM 7.5 MG PO TABS
7.5000 mg | ORAL_TABLET | Freq: Two times a day (BID) | ORAL | 0 refills | Status: DC
  Filled 2023-08-22: qty 30, 15d supply, fill #0
  Filled 2023-09-02 (×2): qty 30, 15d supply, fill #1

## 2023-08-22 MED ORDER — ACETAMINOPHEN 500 MG PO TABS
1000.0000 mg | ORAL_TABLET | Freq: Three times a day (TID) | ORAL | 0 refills | Status: DC | PRN
  Filled 2023-08-22: qty 84, 14d supply, fill #0

## 2023-08-22 MED ORDER — ONDANSETRON 4 MG PO TBDP
4.0000 mg | ORAL_TABLET | Freq: Three times a day (TID) | ORAL | 0 refills | Status: DC | PRN
  Filled 2023-08-22: qty 9, 3d supply, fill #0

## 2023-08-22 MED ORDER — OXYCODONE HCL 5 MG PO TABS
5.0000 mg | ORAL_TABLET | Freq: Four times a day (QID) | ORAL | 0 refills | Status: DC | PRN
  Filled 2023-08-22: qty 30, 4d supply, fill #0

## 2023-08-22 NOTE — H&P (Signed)
PREOPERATIVE H&P  Chief Complaint: left knee lateral meniscus tear, ACL tear  HPI: Robert Ashley is a 42 y.o. male who is scheduled for, Procedure(s): KNEE ARTHROSCOPY WITH ANTERIOR CRUCIATE LIGAMENT (ACL) RECONSTRUCTION KNEE ARTHROSCOPY WITH LATERAL MENISECTOMY.   Patient has a past medical history significant for L ACL tear   Symptoms are rated as moderate to severe, and have been worsening.  This is significantly impairing activities of daily living.    Please see clinic note for further details on this patient's care.    He has elected for surgical management.   Past Medical History:  Diagnosis Date   Anxiety    Chicken pox    GERD (gastroesophageal reflux disease)    tums prn   Hypertension    Past Surgical History:  Procedure Laterality Date   NO PAST SURGERIES     Social History   Socioeconomic History   Marital status: Married    Spouse name: [name]   Number of children: 2   Years of education: Not on file   Highest education level: Not on file  Occupational History   Occupation: Full time2  Tobacco Use   Smoking status: Former    Current packs/day: 0.00    Average packs/day: 1 pack/day for 6.0 years (6.0 ttl pk-yrs)    Types: Cigarettes    Start date: 09/05/2004    Quit date: 09/05/2010    Years since quitting: 12.9   Smokeless tobacco: Never  Vaping Use   Vaping status: Never Used  Substance and Sexual Activity   Alcohol use: Yes    Comment: Social 2-3   Drug use: No   Sexual activity: Yes  Other Topics Concern   Not on file  Social History Narrative   Works in Industrial/product designer- Civil engineer, contracting in [place]   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       Lives with wife and 2 children   Right handed   Drinks no caffeine/ was drinking 1 high caffeine [employer] daily    Social Drivers of Corporate investment banker Strain: Not on file  Food Insecurity: Not on file  Transportation Needs: Not on file  Physical  Activity: Not on file  Stress: Not on file  Social Connections: Not on file   Family History  Problem Relation Age of Onset   Healthy Mother    Hyperlipidemia Father    Hypertension Father    Lung cancer Maternal Grandfather        smoker   Stroke Paternal Grandmother        led to death mid 45s   Hypertension Paternal Grandmother    Dementia Maternal Grandmother    Glaucoma Paternal Grandfather    No Known Allergies Prior to Admission medications   Medication Sig Start Date End Date Taking? Authorizing Provider  amLODipine (NORVASC) 5 MG tablet TAKE ONE TABLET BY MOUTH DAILY 11/15/22  Yes Shelva Majestic, MD  escitalopram (LEXAPRO) 10 MG tablet TAKE ONE TABLET BY MOUTH DAILY 12/12/22  Yes Shelva Majestic, MD  hydrochlorothiazide (HYDRODIURIL) 25 MG tablet TAKE ONE TABLET BY MOUTH DAILY 01/02/23  Yes Shelva Majestic, MD  Multiple Vitamin (MULTIVITAMIN WITH MINERALS) TABS tablet Take 1 tablet by mouth daily.   Yes [provider]  acetaminophen (TYLENOL) 500 MG tablet Take 2 tablets (1,000 mg total) by mouth every 8 (eight) hours as needed. 08/22/23     aspirin 81 MG chewable tablet Take 1 tablet  by mouth twice a day 08/22/23     meloxicam (MOBIC) 7.5 MG tablet Take 1 tablet by mouth twice a day 08/22/23     ondansetron (ZOFRAN-ODT) 4 MG disintegrating tablet Take 1 tablet (4 mg total) by mouth every 8 (eight) to 12 ( twelve) hours as needed. 08/22/23     oxyCODONE (OXY IR/ROXICODONE) 5 MG immediate release tablet Take 1-2 tablets (5-10 mg total) by mouth every 6 (six) hours as needed. Max 6 tablets per days. 08/22/23       ROS: All other systems have been reviewed and were otherwise negative with the exception of those mentioned in the HPI and as above.  Physical Exam: General: Alert, no acute distress Cardiovascular: No pedal edema Respiratory: No cyanosis, no use of accessory musculature GI: No organomegaly, abdomen is soft and non-tender Skin: No lesions in the  area of chief complaint Neurologic: Sensation intact distally Psychiatric: Patient is competent for consent with normal mood and affect Lymphatic: No axillary or cervical lymphadenopathy  MUSCULOSKELETAL:  Exam of the left knee shows a mild effusion.  He is mildly tender to palpation in the medial lateral joint line.  On range of motion, he lacks 5 degrees of extension, and flexion is to 90 degrees.  He does have a grossly positive Lachman anterior drawer.  He is stable to varus/valgus stress at 30 degrees.    Imaging: MRI reviewed on [facility] does show a complete tear of the ACL.  PCL is intact.  MRI shows a pivot shift bone bruise of the left knee.  There is a lateral meniscus tear.   Assessment: left knee lateral meniscus tear, ACL tear  Plan: Plan for Procedure(s): KNEE ARTHROSCOPY WITH ANTERIOR CRUCIATE LIGAMENT (ACL) RECONSTRUCTION KNEE ARTHROSCOPY WITH LATERAL MENISECTOMY    The risks benefits and alternatives were discussed with the patient including but not limited to the risks of nonoperative treatment, versus surgical intervention including infection, bleeding, nerve injury,  blood clots, cardiopulmonary complications, morbidity, mortality, among others, and they were willing to proceed.   The patient acknowledged the explanation, agreed to proceed with the plan and consent was signed.   Operative Plan: Left knee arthroscopy, lateral meniscectomy, ACL repair with Tib Ant allograft Discharge Medications: Given prior to surgery (Oxycodone, meloxicam, tylenol, zofran) DVT Prophylaxis: ASA 81mg  bID x 6 weeks Physical Therapy: outpatient Special Discharge needs: Pattricia Boss (has), Gennaro Africa, PA-C  08/22/2023 12:52 PM

## 2023-08-24 ENCOUNTER — Other Ambulatory Visit: Payer: Self-pay

## 2023-08-24 ENCOUNTER — Encounter (HOSPITAL_BASED_OUTPATIENT_CLINIC_OR_DEPARTMENT_OTHER)
Admission: RE | Disposition: A | Discharge status: Home / Self Care | Payer: Self-pay | Source: Ambulatory Visit | Attending: Orthopaedic Surgery

## 2023-08-24 ENCOUNTER — Ambulatory Visit (HOSPITAL_BASED_OUTPATIENT_CLINIC_OR_DEPARTMENT_OTHER): Payer: PRIVATE HEALTH INSURANCE

## 2023-08-24 ENCOUNTER — Ambulatory Visit (HOSPITAL_BASED_OUTPATIENT_CLINIC_OR_DEPARTMENT_OTHER): Payer: PRIVATE HEALTH INSURANCE | Admitting: Anesthesiology

## 2023-08-24 ENCOUNTER — Encounter (HOSPITAL_BASED_OUTPATIENT_CLINIC_OR_DEPARTMENT_OTHER): Payer: Self-pay | Admitting: Orthopaedic Surgery

## 2023-08-24 ENCOUNTER — Ambulatory Visit (HOSPITAL_BASED_OUTPATIENT_CLINIC_OR_DEPARTMENT_OTHER)
Admission: RE | Admit: 2023-08-24 | Discharge: 2023-08-24 | Disposition: A | Discharge status: Home / Self Care | Payer: PRIVATE HEALTH INSURANCE | Source: Ambulatory Visit | Attending: Orthopaedic Surgery | Admitting: Orthopaedic Surgery

## 2023-08-24 DIAGNOSIS — Z79899 Other long term (current) drug therapy: Secondary | ICD-10-CM | POA: Diagnosis not present

## 2023-08-24 DIAGNOSIS — S83282A Other tear of lateral meniscus, current injury, left knee, initial encounter: Secondary | ICD-10-CM

## 2023-08-24 DIAGNOSIS — Z87891 Personal history of nicotine dependence: Secondary | ICD-10-CM | POA: Insufficient documentation

## 2023-08-24 DIAGNOSIS — I10 Essential (primary) hypertension: Secondary | ICD-10-CM | POA: Diagnosis not present

## 2023-08-24 DIAGNOSIS — Z01818 Encounter for other preprocedural examination: Secondary | ICD-10-CM

## 2023-08-24 DIAGNOSIS — X58XXXA Exposure to other specified factors, initial encounter: Secondary | ICD-10-CM | POA: Diagnosis not present

## 2023-08-24 DIAGNOSIS — S83512A Sprain of anterior cruciate ligament of left knee, initial encounter: Secondary | ICD-10-CM | POA: Diagnosis not present

## 2023-08-24 HISTORY — DX: Essential (primary) hypertension: I10

## 2023-08-24 HISTORY — DX: Anxiety disorder, unspecified: F41.9

## 2023-08-24 HISTORY — PX: KNEE ARTHROSCOPY WITH LATERAL MENISECTOMY: SHX6193

## 2023-08-24 SURGERY — KNEE ARTHROSCOPY WITH ANTERIOR CRUCIATE LIGAMENT (ACL) RECONSTRUCTION WITH HAMSTRING GRAFT
Anesthesia: Regional | Site: Knee | Laterality: Left

## 2023-08-24 MED ORDER — EPINEPHRINE PF 1 MG/ML IJ SOLN
INTRAMUSCULAR | Status: AC
  Filled 2023-08-24: qty 4

## 2023-08-24 MED ORDER — ACETAMINOPHEN 500 MG PO TABS
ORAL_TABLET | ORAL | Status: AC
  Filled 2023-08-24: qty 2

## 2023-08-24 MED ORDER — ONDANSETRON HCL 4 MG/2ML IJ SOLN
INTRAMUSCULAR | Status: DC | PRN
  Administered 2023-08-24: 4 mg via INTRAVENOUS

## 2023-08-24 MED ORDER — BUPIVACAINE HCL (PF) 0.5 % IJ SOLN
INTRAMUSCULAR | Status: DC | PRN
  Administered 2023-08-24: 13 mL via PERINEURAL

## 2023-08-24 MED ORDER — FENTANYL CITRATE (PF) 100 MCG/2ML IJ SOLN
25.0000 ug | INTRAMUSCULAR | Status: DC | PRN
  Administered 2023-08-24 (×2): 25 ug via INTRAVENOUS

## 2023-08-24 MED ORDER — BUPIVACAINE LIPOSOME 1.3 % IJ SUSP
INTRAMUSCULAR | Status: DC | PRN
  Administered 2023-08-24: 10 mL via PERINEURAL

## 2023-08-24 MED ORDER — MIDAZOLAM HCL 5 MG/5ML IJ SOLN
INTRAMUSCULAR | Status: DC | PRN
  Administered 2023-08-24: 2 mg via INTRAVENOUS

## 2023-08-24 MED ORDER — LIDOCAINE 2% (20 MG/ML) 5 ML SYRINGE
INTRAMUSCULAR | Status: DC | PRN
  Administered 2023-08-24: 60 mg via INTRAVENOUS

## 2023-08-24 MED ORDER — FENTANYL CITRATE (PF) 100 MCG/2ML IJ SOLN
INTRAMUSCULAR | Status: DC | PRN
  Administered 2023-08-24 (×3): 50 ug via INTRAVENOUS

## 2023-08-24 MED ORDER — FENTANYL CITRATE (PF) 100 MCG/2ML IJ SOLN
INTRAMUSCULAR | Status: AC
  Filled 2023-08-24: qty 2

## 2023-08-24 MED ORDER — KETOROLAC TROMETHAMINE 30 MG/ML IJ SOLN: 30.0000 mg | Freq: Once | INTRAMUSCULAR | Status: DC | PRN

## 2023-08-24 MED ORDER — LIDOCAINE 2% (20 MG/ML) 5 ML SYRINGE
INTRAMUSCULAR | Status: AC
  Filled 2023-08-24: qty 5

## 2023-08-24 MED ORDER — MIDAZOLAM HCL 2 MG/2ML IJ SOLN
INTRAMUSCULAR | Status: AC
  Filled 2023-08-24: qty 2

## 2023-08-24 MED ORDER — VANCOMYCIN HCL 1000 MG IV SOLR
INTRAVENOUS | Status: AC
  Filled 2023-08-24: qty 40

## 2023-08-24 MED ORDER — TRANEXAMIC ACID-NACL 1000-0.7 MG/100ML-% IV SOLN
INTRAVENOUS | Status: AC
  Filled 2023-08-24: qty 100

## 2023-08-24 MED ORDER — ACETAMINOPHEN 500 MG PO TABS
1000.0000 mg | ORAL_TABLET | Freq: Once | ORAL | Status: AC
Start: 2023-08-24 — End: 2023-08-24
  Administered 2023-08-24: 1000 mg via ORAL

## 2023-08-24 MED ORDER — SODIUM CHLORIDE 0.9 % IR SOLN
Status: DC | PRN
  Administered 2023-08-24: 6000 mL

## 2023-08-24 MED ORDER — ONDANSETRON HCL 4 MG/2ML IJ SOLN
INTRAMUSCULAR | Status: AC
  Filled 2023-08-24: qty 2

## 2023-08-24 MED ORDER — OXYCODONE HCL 5 MG PO TABS
5.0000 mg | ORAL_TABLET | Freq: Once | ORAL | Status: AC | PRN
  Administered 2023-08-24: 5 mg via ORAL

## 2023-08-24 MED ORDER — OXYCODONE HCL 5 MG PO TABS
ORAL_TABLET | ORAL | Status: AC
  Filled 2023-08-24: qty 1

## 2023-08-24 MED ORDER — CEFAZOLIN SODIUM-DEXTROSE 2-4 GM/100ML-% IV SOLN
INTRAVENOUS | Status: AC
Start: 2023-08-24 — End: ?
  Filled 2023-08-24: qty 100

## 2023-08-24 MED ORDER — CEFAZOLIN SODIUM-DEXTROSE 2-4 GM/100ML-% IV SOLN
2.0000 g | INTRAVENOUS | Status: AC
  Administered 2023-08-24: 2 g via INTRAVENOUS

## 2023-08-24 MED ORDER — LACTATED RINGERS IV SOLN: INTRAVENOUS | Status: DC | PRN

## 2023-08-24 MED ORDER — AMISULPRIDE (ANTIEMETIC) 5 MG/2ML IV SOLN
10.0000 mg | Freq: Once | INTRAVENOUS | Status: AC | PRN
  Administered 2023-08-24: 10 mg via INTRAVENOUS

## 2023-08-24 MED ORDER — PROPOFOL 10 MG/ML IV BOLUS
INTRAVENOUS | Status: DC | PRN
  Administered 2023-08-24: 300 mg via INTRAVENOUS

## 2023-08-24 MED ORDER — LACTATED RINGERS IV SOLN: INTRAVENOUS | Status: DC

## 2023-08-24 MED ORDER — AMISULPRIDE (ANTIEMETIC) 5 MG/2ML IV SOLN
INTRAVENOUS | Status: AC
  Filled 2023-08-24: qty 4

## 2023-08-24 MED ORDER — DEXAMETHASONE SODIUM PHOSPHATE 10 MG/ML IJ SOLN
INTRAMUSCULAR | Status: AC
Start: 2023-08-24 — End: ?
  Filled 2023-08-24: qty 1

## 2023-08-24 MED ORDER — MIDAZOLAM HCL 2 MG/2ML IJ SOLN
2.0000 mg | Freq: Once | INTRAMUSCULAR | Status: AC
  Administered 2023-08-24: 2 mg via INTRAVENOUS

## 2023-08-24 MED ORDER — TRANEXAMIC ACID-NACL 1000-0.7 MG/100ML-% IV SOLN
1000.0000 mg | INTRAVENOUS | Status: AC
  Administered 2023-08-24: 1000 mg via INTRAVENOUS

## 2023-08-24 MED ORDER — DEXAMETHASONE SODIUM PHOSPHATE 10 MG/ML IJ SOLN
INTRAMUSCULAR | Status: DC | PRN
  Administered 2023-08-24: 10 mg via INTRAVENOUS

## 2023-08-24 MED ORDER — OXYCODONE HCL 5 MG/5ML PO SOLN: 5.0000 mg | Freq: Once | ORAL | Status: AC | PRN

## 2023-08-24 SURGICAL SUPPLY — 74 items
ANCHOR TIGHTROPE II 20 W/IB (Anchor) IMPLANT
BLADE SHAVER BONE 5.0X13 (MISCELLANEOUS) ×1 IMPLANT
BLADE SURG 10 STRL SS (BLADE) ×1 IMPLANT
BLADE SURG 15 STRL LF DISP TIS (BLADE) ×1 IMPLANT
BNDG COHESIVE 4X5 TAN STRL LF (GAUZE/BANDAGES/DRESSINGS) IMPLANT
BNDG ELASTIC 6INX 5YD STR LF (GAUZE/BANDAGES/DRESSINGS) ×1 IMPLANT
BONE TUNNEL PLUG CANNULATED (MISCELLANEOUS) IMPLANT
BURR OVAL 8 FLU 4.0X13 (MISCELLANEOUS) IMPLANT
CHLORAPREP W/TINT 26 (MISCELLANEOUS) ×1 IMPLANT
CLSR STERI-STRIP ANTIMIC 1/2X4 (GAUZE/BANDAGES/DRESSINGS) IMPLANT
COLLECTOR GRAFT TISSUE (SYSTAGENIX WOUND MANAGEMENT) IMPLANT
COOLER ICEMAN CLASSIC (MISCELLANEOUS) ×1 IMPLANT
COVER BACK TABLE 60X90IN (DRAPES) ×1 IMPLANT
CUFF TRNQT CYL 34X4.125X (TOURNIQUET CUFF) IMPLANT
CUTTER TENSIONER SUT 2-0 0 FBW (INSTRUMENTS) IMPLANT
DISSECTOR 3.5MM X 13CM CVD (MISCELLANEOUS) IMPLANT
DISSECTOR 4.0MMX13CM CVD (MISCELLANEOUS) ×1 IMPLANT
DRAPE IMP U-DRAPE 54X76 (DRAPES) IMPLANT
DRAPE OEC MINIVIEW 54X84 (DRAPES) ×1 IMPLANT
DRAPE U-SHAPE 47X51 STRL (DRAPES) ×1 IMPLANT
DRAPE-T ARTHROSCOPY W/POUCH (DRAPES) ×1 IMPLANT
ELECT REM PT RETURN 9FT ADLT (ELECTROSURGICAL) ×1 IMPLANT
ELECTRODE REM PT RTRN 9FT ADLT (ELECTROSURGICAL) ×1 IMPLANT
FIBERSTICK 2 (SUTURE) IMPLANT
GAUZE SPONGE 4X4 12PLY STRL (GAUZE/BANDAGES/DRESSINGS) ×2 IMPLANT
GLOVE BIO SURGEON STRL SZ 6.5 (GLOVE) ×1 IMPLANT
GLOVE BIOGEL PI IND STRL 6.5 (GLOVE) ×1 IMPLANT
GLOVE BIOGEL PI IND STRL 8 (GLOVE) ×1 IMPLANT
GLOVE ECLIPSE 8.0 STRL XLNG CF (GLOVE) ×1 IMPLANT
GOWN STRL REUS W/ TWL LRG LVL3 (GOWN DISPOSABLE) ×2 IMPLANT
GOWN STRL REUS W/TWL XL LVL3 (GOWN DISPOSABLE) ×1 IMPLANT
GRAFT ROPE FROZEN (Tissue) ×1 IMPLANT
GRAFT TISS ANT TIB TNDN (Tissue) IMPLANT
GRAFT TISS ROPE SEMITEND 4-5.5 (Tissue) IMPLANT
IMMOBILIZER KNEE 20 (SOFTGOODS) IMPLANT
IMMOBILIZER KNEE 20 THIGH 36 (SOFTGOODS) IMPLANT
IMMOBILIZER KNEE 22 UNIV (SOFTGOODS) IMPLANT
IMMOBILIZER KNEE 24 THIGH 36 (MISCELLANEOUS) IMPLANT
IMMOBILIZER KNEE 24 UNIV (MISCELLANEOUS) ×1 IMPLANT
IV NS IRRIG 3000ML ARTHROMATIC (IV SOLUTION) ×4 IMPLANT
KIT TRANSTIBIAL (DISPOSABLE) ×1 IMPLANT
NDL SAFETY ECLIPSE 18X1.5 (NEEDLE) ×1 IMPLANT
NDL SUT 2-0 SCORPION KNEE (NEEDLE) IMPLANT
NEEDLE SUT 2-0 SCORPION KNEE (NEEDLE) IMPLANT
NS IRRIG 1000ML POUR BTL (IV SOLUTION) ×1 IMPLANT
PACK ARTHROSCOPY DSU (CUSTOM PROCEDURE TRAY) ×1 IMPLANT
PACK BASIN DAY SURGERY FS (CUSTOM PROCEDURE TRAY) IMPLANT
PAD CAST 4YDX4 CTTN HI CHSV (CAST SUPPLIES) ×1 IMPLANT
PAD COLD SHLDR WRAP-ON (PAD) ×1 IMPLANT
PENCIL SMOKE EVACUATOR (MISCELLANEOUS) ×1 IMPLANT
PORTAL SKID DEVICE (INSTRUMENTS) IMPLANT
SCREW FT BIOCOMP 10X30 (Screw) IMPLANT
SHEET MEDIUM DRAPE 40X70 STRL (DRAPES) ×1 IMPLANT
SLEEVE SCD COMPRESS KNEE MED (STOCKING) ×1 IMPLANT
SPIKE FLUID TRANSFER (MISCELLANEOUS) IMPLANT
SPONGE T-LAP 4X18 ~~LOC~~+RFID (SPONGE) ×1 IMPLANT
SUT 0 FIBERLOOP 38 BLUE TPR ND (SUTURE) IMPLANT
SUT 2 FIBERLOOP 20 STRT BLUE (SUTURE) ×4 IMPLANT
SUT FIBERWIRE #2 38 T-5 BLUE (SUTURE) ×1 IMPLANT
SUT MNCRL AB 4-0 PS2 18 (SUTURE) ×1 IMPLANT
SUT PDS AB 0 CT 36 (SUTURE) IMPLANT
SUT VIC AB 0 CT1 27XBRD ANBCTR (SUTURE) ×2 IMPLANT
SUT VIC AB 1 CT1 27XBRD ANBCTR (SUTURE) IMPLANT
SUT VIC AB 3-0 SH 27X BRD (SUTURE) ×1 IMPLANT
SUTURE 0 FIBERLP 38 BLU TPR ND (SUTURE) IMPLANT
SUTURE 2 FIBERLOOP 20 STRT BLU (SUTURE) IMPLANT
SUTURE FIBERWR #2 38 T-5 BLUE (SUTURE) IMPLANT
TENDON ANTERIOR TIBIALIS (Tissue) ×1 IMPLANT
TISSUE GRAFT COLLECTOR (SYSTAGENIX WOUND MANAGEMENT) IMPLANT
TOWEL GREEN STERILE FF (TOWEL DISPOSABLE) ×1 IMPLANT
TUBE CONNECTING 20X1/4 (TUBING) ×1 IMPLANT
TUBE SUCTION HIGH CAP CLEAR NV (SUCTIONS) ×1 IMPLANT
TUBING ARTHROSCOPY IRRIG 16FT (MISCELLANEOUS) ×1 IMPLANT
WAND ABLATOR APOLLO I90 (BUR) ×1 IMPLANT

## 2023-08-24 NOTE — Transfer of Care (Signed)
Immediate Anesthesia Transfer of Care Note  Patient: Robert Ashley  Procedure(s) Performed: KNEE ARTHROSCOPY WITH ANTERIOR CRUCIATE LIGAMENT (ACL) RECONSTRUCTION (Left: Knee) KNEE ARTHROSCOPY WITH LATERAL MENISECTOMY (Left: Knee)  Patient Location: PACU  Anesthesia Type:GA combined with regional for post-op pain  Level of Consciousness: sedated  Airway & Oxygen Therapy: Patient Spontanous Breathing and Patient connected to face mask oxygen  Post-op Assessment: Report given to RN and Post -op Vital signs reviewed and stable  Post vital signs: Reviewed and stable  Last Vitals:  Vitals Value Taken Time  BP 112/82 08/24/23 0925  Temp    Pulse 66 08/24/23 0928  Resp 12 08/24/23 0928  SpO2 99 % 08/24/23 0928  Vitals shown include unfiled device data.  Last Pain:  Vitals:   08/24/23 0659  PainSc: 0-No pain      Patients Stated Pain Goal: 4 (08/24/23 0659)  Complications: No notable events documented.

## 2023-08-24 NOTE — Progress Notes (Signed)
AssistedDr. Roanna Banning with left, adductor canal, ultrasound guided block. Side rails up, monitors on throughout procedure. See vital signs in flow sheet. Tolerated Procedure well. ? ?

## 2023-08-24 NOTE — Op Note (Signed)
Orthopaedic Surgery Operative Note (CSN: [id number])  Robert Ashley  [birth year] Date of Surgery: 08/24/2023   Diagnoses:  Left lateral meniscus tear and complete ACL tear  Procedure: Left ACL reconstruction allograft Left partial lateral meniscectomy   Operative Finding Left knee had full range of motion grossly unstable Lachman.  The cartilage surfaces were essentially pristine.  There was a complex lateral meniscus tear encompassing about 20% total meniscal volume.  We are able to resect a small amount that was torn as was quite central.  ACL was completely torn and a 10 mm graft was made using a tibialis anterior and a gracillis.    Post-operative plan: The patient will be weightbearing as tolerated in a knee immobilizer transitioned out of the brace at first visit.  The patient will be discharged home.  DVT prophylaxis Aspirin 81 mg twice daily for 6 weeks.  Pain control with PRN pain medication preferring oral medicines.  Follow up plan will be scheduled in approximately 7 days for incision check and XR.  Post-Op Diagnosis: Same Surgeons:Primary: Bjorn Pippin, MD Assistants: Darron Doom, RNFA Location: [facility] OR ROOM 1 Anesthesia: General with adductor canal with Exparel Antibiotics: Ancef 2 g with local vancomycin powder 1 g at the surgical site Tourniquet time: 30 Estimated Blood Loss: Minimal Complications: None Specimens: None Implants: Implant Name Type Inv. Item Serial No. Manufacturer Lot No. LRB No. Used Action  GRAFT ROPE FROZEN - [id number] Tissue GRAFT ROPE FROZEN [id number] [facility] [id number] Left 1 Implanted  TENDON ANTERIOR TIBIALIS - [id number] Tissue TENDON ANTERIOR TIBIALIS [id number] [facility] [id number] Left 1 Implanted  ANCHOR TIGHTROPE II 20 W/IB - [id number] Anchor ANCHOR TIGHTROPE II 20 W/IB  [employer] [id number] Left 1 Implanted  SCREW FT BIOCOMP 10X30 - [id number] Screw SCREW FT BIOCOMP 10X30  [employer] [id number] Left 1  Implanted    Indications for Surgery:   Robert Ashley is a 42 y.o. male with ACL rupture and lateral meniscus tear.  Benefits and risks of operative and nonoperative management were discussed prior to surgery with patient/guardian(s) and informed consent form was completed.  Specific risks including infection, need for additional surgery, rerupture, stiffness, post meniscectomy syndrome amongst others.   Procedure:   The patient was identified properly. Informed consent was obtained and the surgical site was marked. The patient was taken up to suite where general anesthesia was induced. The patient was placed in the supine position with a post against the surgical leg and a nonsterile tourniquet applied. The surgical leg was then prepped and draped usual sterile fashion.  A standard surgical timeout was performed.  2 standard anterior portals were made and diagnostic arthroscopy performed. Please note the findings as noted above.  A tibialis anterior graft + frope was prepared on the back table to a size of 10 mm and 90 mm in length  We began arthroscopy and made our lateral and medial portals in the typical fashion. Fat pad was resected and diagnostic arthroscopy performed with the findings listed above.   Partial lateral meniscectomy performed as above with a shaver and basket back to stable base.  The anterior cruciate ligament stump was debrided utilizing a shaver taking great care to preserve the remnant stump on the femur and the tibia for localization of our tunnels. Once the remnant anterior cruciate ligament was removed and we obtained appropriate visualization by performing a small notchplasty and confirmed that we had indeed identify the over-the-top position. We made small marks at the location of the aperture  of the tibial and femoral tunnels and double checked our location prior to drilling.  Once we identified the appropriate position of the femoral aperture of the ACL we  hyperflexed the knee and used a 7 mm offset guide through an anterior medial portal to drill a pin.  We then reamed a 10 mm tunnel by 30 mm in length.  We overreamed from the lateral side with a 4 mm reamer to allow passage of our button.  We shuttled a passing suture.  We then turned our attention to the tibial side.  We cleared the tissue from the tibial aperture using the anterior horn of the lateral meniscus as a guide.  We were able to then use a freehand technique to place a 2.4 pin in the appropriate anatomic position.  We reamed a complete tunnel.  We shuttled our suture for eventual passage.  We began passing the graft through the tibial tunnel in an antegrade fashion.  Femoral fixation was with a tight rope device and we checked its placement on the lateral periosteum with fluoroscopy.  We verified arthroscopically that there is no sign of graft impingement on the notch. We then cycled the knee multiple times and turned our attention to the tibia.  Tibia was fixed with a 10X 30 mm bio composite Arthrex screw.  There was good purchase of the screw and the screw protrusion thus we felt there is no need to back up this fixation.  At this point a gentle Lachman maneuver was performed and there is a stable endpoint and no translation.   Incisions closed with absorbable suture. The patient was awoken from general anesthesia and taken to the PACU in stable condition without complication.

## 2023-08-24 NOTE — Anesthesia Procedure Notes (Addendum)
Procedure Name: LMA Insertion Date/Time: 08/24/2023 8:18 AM  Performed by: Burna Cash, CRNAPre-anesthesia Checklist: Patient identified, Emergency Drugs available, Suction available and Patient being monitored Patient Re-evaluated:Patient Re-evaluated prior to induction Oxygen Delivery Method: Circle system utilized Preoxygenation: Pre-oxygenation with 100% oxygen Induction Type: IV induction Ventilation: Mask ventilation without difficulty LMA: LMA inserted LMA Size: 5.0 Number of attempts: 1 Airway Equipment and Method: Bite block Placement Confirmation: positive ETCO2 Tube secured with: Tape Dental Injury: Teeth and Oropharynx as per pre-operative assessment

## 2023-08-24 NOTE — Anesthesia Postprocedure Evaluation (Signed)
Anesthesia Post Note  Patient: Robert Ashley  Procedure(s) Performed: KNEE ARTHROSCOPY WITH ANTERIOR CRUCIATE LIGAMENT (ACL) RECONSTRUCTION (Left: Knee) KNEE ARTHROSCOPY WITH LATERAL MENISECTOMY (Left: Knee)     Patient location during evaluation: PACU Anesthesia Type: Regional and General Level of consciousness: awake Pain management: pain level controlled Vital Signs Assessment: post-procedure vital signs reviewed and stable Respiratory status: spontaneous breathing, nonlabored ventilation and respiratory function stable Cardiovascular status: blood pressure returned to baseline and stable Postop Assessment: no apparent nausea or vomiting Anesthetic complications: no   No notable events documented.  Last Vitals:  Vitals:   08/24/23 1015 08/24/23 1043  BP: 128/78 128/86  Pulse: 80 81  Resp: 11 18  Temp:  (!) 36.2 C  SpO2: 95% 97%    Last Pain:  Vitals:   08/24/23 1043  PainSc: 4                  Rashod Gougeon P Rayder Sullenger

## 2023-08-24 NOTE — Anesthesia Procedure Notes (Signed)
Anesthesia Regional Block: Adductor canal block   Pre-Anesthetic Checklist: , timeout performed,  Correct Patient, Correct Site, Correct Laterality,  Correct Procedure,, site marked,  Risks and benefits discussed,  Surgical consent,  Pre-op evaluation,  At surgeon's request and post-op pain management  Laterality: Left  Prep: chloraprep       Needles:  Injection technique: Single-shot  Needle Type: Echogenic Stimulator Needle     Needle Length: 10cm  Needle Gauge: 20     Additional Needles:   Procedures:,,,, ultrasound used (permanent image in chart),,    Narrative:  Start time: 08/24/2023 7:40 AM End time: 08/24/2023 7:50 AM Injection made incrementally with aspirations every 5 mL.  Performed by: Personally  Anesthesiologist: Leonides Grills, MD  Additional Notes: Functioning IV was confirmed and monitors were applied. A time-out was performed. Hand hygiene and sterile gloves were used. The thigh was placed in a frog-leg position and prepped in a sterile fashion. A 20ga Bbraun echogenic stimulator needle was placed using ultrasound guidance.  Negative aspiration and negative test dose prior to incremental administration of local anesthetic. The patient tolerated the procedure well.

## 2023-08-24 NOTE — Discharge Instructions (Addendum)
Ramond Marrow MD, MPH Alfonse Alpers, PA-C [facility] [address] [phone] (tel)   [phone] (fax)   POST-OPERATIVE INSTRUCTIONS - ACL RECONSTRUCTION  WOUND CARE You may remove the Operative Dressing on Post-Op Day #3 (72hrs after surgery).   Leave steri strips in place.   If you feel more comfortable with it you can leave all dressings in place till your 1 week follow-up appointment.   KEEP THE INCISIONS CLEAN AND DRY. An ACE wrap may be used to control swelling, do not wrap this too tight.  If the initial ACE wrap feels too tight or constricting you may loosen it. There may be a small amount of fluid/bleeding leaking at the surgical site.  This is normal; the knee is filled with fluid during the procedure and can leak for 24-48hrs after surgery.  You may change/reinforce the bandage as needed.  Use the Cryocuff, GameReady or Ice as often as possible for the first 3-4 days, then as needed for pain relief. Always keep a towel, ACE wrap or other barrier between the cooling unit and your skin.  You may shower on Post-Op Day #3. Gently pat the area dry.  Do not soak the knee in water.  Do not go swimming in the pool or ocean until 4 weeks after surgery or when otherwise instructed.  BRACE/AMBULATION Your leg will be placed in a brace post-operatively.  You may remove for hygiene only! You will need to wear your brace at all times until we discuss it further.  It should be locked in full extension (0 degrees) if adjustable.   You will be instructed on further bracing after your first visit. Use crutches for comfort but you can put your full weight on the leg as tolerated.  PHYSICAL THERAPY - You will begin physical therapy soon after surgery (unless otherwise specified) - Please call to set up an appointment, if you do not already have one  - Let our office if there are any issues with scheduling your therapy  - You have a physical therapy  appointment scheduled at [facility] (across the hall from our office) on 12/30   REGIONAL ANESTHESIA (NERVE BLOCKS) The anesthesia team may have performed a nerve block for you this is a great tool used to minimize pain.   The block may start wearing off overnight (between 8-24 hours postop) When the block wears off, your pain may go from nearly zero to the pain you would have had postop without the block. This is an abrupt transition but nothing dangerous is happening.   This can be a challenging period but utilize your as needed pain medications to try and manage this period. We suggest you use the pain medication the first night prior to going to bed, to ease this transition.  You may take an extra dose of narcotic when this happens if needed  POST-OP MEDICATIONS- Multimodal approach to pain control In general your pain will be controlled with a combination of substances.  Prescriptions unless otherwise discussed are electronically sent to your pharmacy.  This is a carefully made plan we use to minimize narcotic use.     Meloxicam - Anti-inflammatory medication taken on a scheduled basis Acetaminophen - Non-narcotic pain medicine taken on a scheduled basis  Oxycodone - This is a strong narcotic, to be used only on an "as needed" basis for SEVERE pain. Aspirin 81mg  - This medicine is used to minimize the risk of blood clots after surgery. Zofran -  take as needed for nausea   FOLLOW-UP Please call the office to schedule a follow-up appointment for your incision check if you do not already have one, 7-10 days post-operatively. IF YOU HAVE ANY QUESTIONS, PLEASE FEEL FREE TO CALL OUR OFFICE.  HELPFUL INFORMATION   Keep your leg elevated to decrease swelling, which will then in turn decrease your pain. I would elevate the foot of your bed by putting a couple of couch pillows between your mattress and box spring. I would not keep pillow directly under your ankle.  You must wear the brace  locked while sleeping and ambulating until follow-up.   There will be MORE swelling on days 1-3 than there is on the day of surgery.  This also is normal. The swelling will decrease with the anti-inflammatory medication, ice and keeping it elevated. The swelling will make it more difficult to bend your knee. As the swelling goes down your motion will become easier  You may develop swelling and bruising that extends from your knee down to your calf and perhaps even to your foot over the next week. Do not be alarmed. This too is normal, and it is due to gravity  There may be some numbness adjacent to the incision site. This may last for 6-12 months or longer in some patients and is expected.  You may return to sedentary work/school in the next couple of days when you feel up to it. You will need to keep your leg elevated as much as possible   You should wean off your narcotic medicines as soon as you are able.  Most patients will be off narcotics before their first postop appointment.   We suggest you use the pain medication the first night prior to going to bed, in order to ease any pain when the anesthesia wears off. You should avoid taking pain medications on an empty stomach as it will make you nauseous.  Do not drink alcoholic beverages or take illicit drugs when taking pain medications.  It is against the law to drive while taking narcotics. You cannot drive if your Right leg is in brace locked in extension.  Pain medication may make you constipated.  Below are a few solutions to try in this order: Decrease the amount of pain medication if you aren't having pain. Drink lots of decaffeinated fluids. Drink prune juice and/or each dried prunes  If the first 3 don't work start with additional solutions Take Colace - an over-the-counter stool softener Take Senokot - an over-the-counter laxative Take Miralax - a stronger over-the-counter laxative  For more information including helpful  videos and documents visit our website:   [email or web address]     Post Anesthesia Home Care Instructions  Activity: Get plenty of rest for the remainder of the day. A responsible individual must stay with you for 24 hours following the procedure.  For the next 24 hours, DO NOT: -Drive a car -Advertising copywriter -Drink alcoholic beverages -Take any medication unless instructed by your physician -Make any legal decisions or sign important papers.  Meals: Start with liquid foods such as gelatin or soup. Progress to regular foods as tolerated. Avoid greasy, spicy, heavy foods. If nausea and/or vomiting occur, drink only clear liquids until the nausea and/or vomiting subsides. Call your physician if vomiting continues.  Special Instructions/Symptoms: Your throat may feel dry or sore from the anesthesia or the breathing tube placed in your throat during surgery. If this causes discomfort, gargle with warm salt water. The discomfort  should disappear within 24 hours.  If you had a scopolamine patch placed behind your ear for the management of post- operative nausea and/or vomiting:  1. The medication in the patch is effective for 72 hours, after which it should be removed.  Wrap patch in a tissue and discard in the trash. Wash hands thoroughly with soap and water. 2. You may remove the patch earlier than 72 hours if you experience unpleasant side effects which may include dry mouth, dizziness or visual disturbances. 3. Avoid touching the patch. Wash your hands with soap and water after contact with the patch.     Information for Discharge Teaching: EXPAREL (bupivacaine liposome injectable suspension)   Pain relief is important to your recovery. The goal is to control your pain so you can move easier and return to your normal activities as soon as possible after your procedure. Your physician may use several types of medicines to manage pain, swelling, and  more.  Your surgeon or anesthesiologist gave you EXPAREL(bupivacaine) to help control your pain after surgery.  EXPAREL is a local anesthetic designed to release slowly over an extended period of time to provide pain relief by numbing the tissue around the surgical site. EXPAREL is designed to release pain medication over time and can control pain for up to 72 hours. Depending on how you respond to EXPAREL, you may require less pain medication during your recovery. EXPAREL can help reduce or eliminate the need for opioids during the first few days after surgery when pain relief is needed the most. EXPAREL is not an opioid and is not addictive. It does not cause sleepiness or sedation.   Important! A teal colored band has been placed on your arm with the date, time and amount of EXPAREL you have received. Please leave this armband in place for the full 96 hours following administration, and then you may remove the band. If you return to the hospital for any reason within 96 hours following the administration of EXPAREL, the armband provides important information that your health care providers to know, and alerts them that you have received this anesthetic.    Possible side effects of EXPAREL: Temporary loss of sensation or ability to move in the area where medication was injected. Nausea, vomiting, constipation Rarely, numbness and tingling in your mouth or lips, lightheadedness, or anxiety may occur. Call your doctor right away if you think you may be experiencing any of these sensations, or if you have other questions regarding possible side effects.  Follow all other discharge instructions given to you by your surgeon or nurse. Eat a healthy diet and drink plenty of water or other fluids.

## 2023-08-24 NOTE — Interval H&P Note (Signed)
All questions answered

## 2023-08-24 NOTE — Anesthesia Preprocedure Evaluation (Addendum)
Anesthesia Evaluation  Patient identified by MRN, date of birth, ID band Patient awake    Reviewed: Allergy & Precautions, NPO status , Patient's Chart, lab work & pertinent test results  Airway Mallampati: III  TM Distance: >3 FB Neck ROM: Full    Dental no notable dental hx.    Pulmonary former smoker   Pulmonary exam normal        Cardiovascular hypertension, Pt. on medications Normal cardiovascular exam     Neuro/Psych  PSYCHIATRIC DISORDERS Anxiety     negative neurological ROS     GI/Hepatic   Endo/Other  negative endocrine ROS    Renal/GU negative Renal ROS     Musculoskeletal negative musculoskeletal ROS (+)    Abdominal   Peds  Hematology negative hematology ROS (+)   Anesthesia Other Findings left knee lateral meniscus tear, ACL tear  Reproductive/Obstetrics                             Anesthesia Physical Anesthesia Plan  ASA: 2  Anesthesia Plan: General and Regional   Post-op Pain Management: Regional block*   Induction: Intravenous  PONV Risk Score and Plan: 2 and Ondansetron, Dexamethasone, Midazolam and Treatment may vary due to age or medical condition  Airway Management Planned: LMA  Additional Equipment:   Intra-op Plan:   Post-operative Plan: Extubation in OR  Informed Consent: I have reviewed the patients History and Physical, chart, labs and discussed the procedure including the risks, benefits and alternatives for the proposed anesthesia with the patient or authorized representative who has indicated his/her understanding and acceptance.     Dental advisory given  Plan Discussed with: CRNA  Anesthesia Plan Comments:        Anesthesia Quick Evaluation

## 2023-08-25 ENCOUNTER — Encounter (HOSPITAL_BASED_OUTPATIENT_CLINIC_OR_DEPARTMENT_OTHER): Payer: Self-pay | Admitting: Orthopaedic Surgery

## 2023-09-02 ENCOUNTER — Other Ambulatory Visit (HOSPITAL_BASED_OUTPATIENT_CLINIC_OR_DEPARTMENT_OTHER): Payer: Self-pay

## 2023-10-10 ENCOUNTER — Other Ambulatory Visit: Payer: Self-pay | Admitting: Family Medicine

## 2023-10-18 ENCOUNTER — Encounter: Payer: Self-pay | Admitting: Family Medicine

## 2023-10-19 ENCOUNTER — Other Ambulatory Visit (HOSPITAL_BASED_OUTPATIENT_CLINIC_OR_DEPARTMENT_OTHER): Payer: Self-pay

## 2023-10-19 ENCOUNTER — Other Ambulatory Visit: Payer: Self-pay

## 2023-10-19 MED ORDER — HYDROCHLOROTHIAZIDE 25 MG PO TABS
25.0000 mg | ORAL_TABLET | Freq: Every day | ORAL | 2 refills | Status: DC
  Filled 2023-10-19: qty 90, 90d supply, fill #0
  Filled 2024-01-17: qty 90, 90d supply, fill #1
  Filled 2024-04-30: qty 30, 30d supply, fill #2
  Filled 2024-05-29: qty 30, 30d supply, fill #3
  Filled 2024-07-01: qty 30, 30d supply, fill #4

## 2023-10-24 ENCOUNTER — Other Ambulatory Visit: Payer: Self-pay

## 2023-10-24 ENCOUNTER — Other Ambulatory Visit (HOSPITAL_BASED_OUTPATIENT_CLINIC_OR_DEPARTMENT_OTHER): Payer: Self-pay

## 2023-11-16 ENCOUNTER — Encounter: Payer: Self-pay | Admitting: Family Medicine

## 2023-11-23 ENCOUNTER — Encounter: Payer: Self-pay | Admitting: Family Medicine

## 2023-11-23 ENCOUNTER — Ambulatory Visit (INDEPENDENT_AMBULATORY_CARE_PROVIDER_SITE_OTHER): Payer: Self-pay | Admitting: Family Medicine

## 2023-11-23 VITALS — BP 118/70 | HR 87 | Temp 98.6°F | Ht 75.0 in | Wt 220.6 lb

## 2023-11-23 DIAGNOSIS — I10 Essential (primary) hypertension: Secondary | ICD-10-CM

## 2023-11-23 DIAGNOSIS — F411 Generalized anxiety disorder: Secondary | ICD-10-CM | POA: Diagnosis not present

## 2023-11-23 DIAGNOSIS — F1090 Alcohol use, unspecified, uncomplicated: Secondary | ICD-10-CM | POA: Diagnosis not present

## 2023-11-23 NOTE — Patient Instructions (Addendum)
 Online AA meeting option [email or web address]  Consider finding a way to work outside the home for several hours  We also discussed Disulfram once you have quit to help stay off of alcohol Seeing an individual therapist  Give me an update in a week or two  Recommended follow up: Return in about 1-2 month (around 4-5//20/2025) for followup or sooner if needed.Schedule b4 you leave.

## 2023-11-23 NOTE — Progress Notes (Signed)
 Phone [phone] In person visit   Subjective:   Robert Ashley is a 43 y.o. year old very pleasant male patient who presents for/with See problem oriented charting Chief Complaint  Patient presents with   Alcohol Problem   Past Medical History-  Patient Active Problem List   Diagnosis Date Noted   Alcohol use disorder 11/23/2023    Priority: Medium    GAD (generalized anxiety disorder) 08/24/2018    Priority: Medium    Essential hypertension     Priority: Medium    Marijuana use 01/04/2021    Priority: Low   GERD (gastroesophageal reflux disease)     Priority: Low   Syncope and collapse 01/04/2021   ADHD (attention deficit hyperactivity disorder), combined type 12/19/2018    Medications- reviewed and updated Current Outpatient Medications  Medication Sig Dispense Refill   amLODipine (NORVASC) 5 MG tablet TAKE ONE TABLET BY MOUTH DAILY 90 tablet 3   escitalopram (LEXAPRO) 10 MG tablet TAKE ONE TABLET BY MOUTH DAILY 90 tablet 3   hydrochlorothiazide (HYDRODIURIL) 25 MG tablet Take 1 tablet (25 mg total) by mouth daily. 90 tablet 2   Multiple Vitamin (MULTIVITAMIN WITH MINERALS) TABS tablet Take 1 tablet by mouth daily.     No current facility-administered medications for this visit.     Objective:  BP 118/70   Pulse 87   Temp 98.6 F (37 C)   Ht 6\' 3"  (1.905 m)   Wt 220 lb 9.6 oz (100.1 kg)   SpO2 96%   BMI 27.57 kg/m  Gen: NAD, resting comfortably CV: RRR no murmurs rubs or gallops Lungs: CTAB no crackles, wheeze, rhonchi Ext: no edema Skin: warm, dry    Assessment and Plan   # Alcohol abuse S:works from home and makes alcohol more accessible to him.  Able to still function which makes it more accessible- has not had any issues with work. Physical in 2023 was around 5 beverages a day but as stress has increased with work and responsibilities he has increased. May have drink before presentation at home and helpful.  -pint of gin per day about 10-11  shots on Monday to Friday. No alcohol with wife and kids at home for 48 hours -feels getting back into an office would be helpful but loves his job and doesn't have in office option -wife is aware. He has gone up to 6 weeks with no withdrawal symptoms in psat.  -has quit cigarettes and adderrall in past without issues.  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 Everardo Pacific -Desire to quit and readiness to quit with over 7 with reasons being long-term health and the potential to get in the way of caring for his children in the future or providing for his family.  Very supportive wife   #hypertension S: medication: Amlodipine 5 mg, hydrochlorothiazide 25 mg -Cough on valsartan in the past (3% chance of cough) A/P: Blood pressure well-controlled on 2 different readings-continue current medication  # Anxiety S:Medication: Escitalopram 10 mg Counseling: None No Suicidal ideation and never has A/P: Patient feels like in general  escitalopram is helpful but work-related anxiety has been high-consider therapy in the future as add-on treatment to escitalopram-hesitant to increase escitalopram well actively drinking at such high levels  Recommended follow up: Return in about 1 month (around 12/24/2023) for followup or sooner if needed.Schedule b4 you leave. Future Appointments  Date Time Provider Department Center  12/25/2023  3:00 PM Terelle, Dobler, MD [facility]  05/14/2024  1:20 PM Shelva Majestic, MD [facility]    Lab/Order associations:   ICD-10-CM   1. Alcohol use disorder  F10.90     2. Essential hypertension  I10     3. GAD (generalized anxiety disorder)  F41.1       No orders of the defined types were placed in this encounter.   Return precautions advised.  Tana Conch, MD

## 2023-11-30 ENCOUNTER — Other Ambulatory Visit (HOSPITAL_BASED_OUTPATIENT_CLINIC_OR_DEPARTMENT_OTHER): Payer: Self-pay

## 2023-11-30 MED ORDER — DISULFIRAM 250 MG PO TABS
250.0000 mg | ORAL_TABLET | Freq: Every day | ORAL | 5 refills | Status: DC
  Filled 2023-11-30: qty 30, 30d supply, fill #0
  Filled 2023-12-27: qty 30, 30d supply, fill #1
  Filled 2024-01-25: qty 30, 30d supply, fill #2
  Filled 2024-02-24 (×2): qty 30, 30d supply, fill #3
  Filled 2024-04-03: qty 30, 30d supply, fill #4

## 2023-11-30 MED ORDER — DISULFIRAM 250 MG PO TABS: 250.0000 mg | ORAL_TABLET | Freq: Every day | ORAL | 5 refills | Status: DC

## 2023-12-04 ENCOUNTER — Other Ambulatory Visit: Payer: Self-pay | Admitting: Family Medicine

## 2023-12-25 ENCOUNTER — Other Ambulatory Visit: Payer: Self-pay | Admitting: Family Medicine

## 2023-12-25 ENCOUNTER — Ambulatory Visit: Payer: PRIVATE HEALTH INSURANCE | Admitting: Family Medicine

## 2023-12-27 ENCOUNTER — Other Ambulatory Visit (HOSPITAL_BASED_OUTPATIENT_CLINIC_OR_DEPARTMENT_OTHER): Payer: Self-pay

## 2024-01-17 ENCOUNTER — Other Ambulatory Visit (HOSPITAL_BASED_OUTPATIENT_CLINIC_OR_DEPARTMENT_OTHER): Payer: Self-pay

## 2024-01-25 ENCOUNTER — Other Ambulatory Visit (HOSPITAL_BASED_OUTPATIENT_CLINIC_OR_DEPARTMENT_OTHER): Payer: Self-pay

## 2024-01-30 ENCOUNTER — Other Ambulatory Visit (HOSPITAL_BASED_OUTPATIENT_CLINIC_OR_DEPARTMENT_OTHER): Payer: Self-pay

## 2024-02-24 ENCOUNTER — Other Ambulatory Visit (HOSPITAL_BASED_OUTPATIENT_CLINIC_OR_DEPARTMENT_OTHER): Payer: Self-pay

## 2024-04-03 ENCOUNTER — Other Ambulatory Visit (HOSPITAL_BASED_OUTPATIENT_CLINIC_OR_DEPARTMENT_OTHER): Payer: Self-pay

## 2024-04-04 ENCOUNTER — Other Ambulatory Visit: Payer: Self-pay

## 2024-04-30 ENCOUNTER — Other Ambulatory Visit (HOSPITAL_BASED_OUTPATIENT_CLINIC_OR_DEPARTMENT_OTHER): Payer: Self-pay

## 2024-05-14 ENCOUNTER — Other Ambulatory Visit (HOSPITAL_BASED_OUTPATIENT_CLINIC_OR_DEPARTMENT_OTHER): Payer: Self-pay

## 2024-05-14 ENCOUNTER — Ambulatory Visit (INDEPENDENT_AMBULATORY_CARE_PROVIDER_SITE_OTHER): Payer: PRIVATE HEALTH INSURANCE | Admitting: Family Medicine

## 2024-05-14 VITALS — BP 128/82 | HR 82 | Temp 97.5°F | Ht 75.0 in | Wt 210.2 lb

## 2024-05-14 DIAGNOSIS — F1090 Alcohol use, unspecified, uncomplicated: Secondary | ICD-10-CM | POA: Diagnosis not present

## 2024-05-14 DIAGNOSIS — E663 Overweight: Secondary | ICD-10-CM

## 2024-05-14 DIAGNOSIS — F411 Generalized anxiety disorder: Secondary | ICD-10-CM | POA: Diagnosis not present

## 2024-05-14 DIAGNOSIS — I10 Essential (primary) hypertension: Secondary | ICD-10-CM | POA: Diagnosis not present

## 2024-05-14 DIAGNOSIS — Z0000 Encounter for general adult medical examination without abnormal findings: Secondary | ICD-10-CM | POA: Diagnosis not present

## 2024-05-14 DIAGNOSIS — Z131 Encounter for screening for diabetes mellitus: Secondary | ICD-10-CM

## 2024-05-14 DIAGNOSIS — R634 Abnormal weight loss: Secondary | ICD-10-CM

## 2024-05-14 MED ORDER — DISULFIRAM 250 MG PO TABS
250.0000 mg | ORAL_TABLET | Freq: Every day | ORAL | 3 refills | Status: AC
  Filled 2024-05-14 – 2024-06-05 (×2): qty 30, 30d supply, fill #0
  Filled 2024-07-01: qty 30, 30d supply, fill #1
  Filled 2024-07-31: qty 30, 30d supply, fill #2
  Filled 2024-09-01: qty 30, 30d supply, fill #3

## 2024-05-14 NOTE — Progress Notes (Signed)
 Phone: [phone]    Subjective:  Patient presents today for their annual physical. Chief complaint-noted.   See problem oriented charting- ROS- full  review of systems was completed and negative  except for topics noted under acute/chronic concerns  The following were reviewed and entered/updated in epic: Past Medical History:  Diagnosis Date   Anxiety    Chicken pox    GERD (gastroesophageal reflux disease)    tums prn   Hypertension    Patient Active Problem List   Diagnosis Date Noted   Alcohol use disorder 11/23/2023    Priority: Medium    GAD (generalized anxiety disorder) 08/24/2018    Priority: Medium    Essential hypertension     Priority: Medium    Marijuana use 01/04/2021    Priority: Low   GERD (gastroesophageal reflux disease)     Priority: Low   Syncope and collapse 01/04/2021   ADHD (attention deficit hyperactivity disorder), combined type 12/19/2018   Past Surgical History:  Procedure Laterality Date   JOINT REPLACEMENT  DEC 2024   ACL replacement   KNEE ARTHROSCOPY WITH LATERAL MENISECTOMY Left 08/24/2023   Procedure: KNEE ARTHROSCOPY WITH LATERAL MENISECTOMY;  Surgeon: Cristy Bonner DASEN, MD;  [facility];  Service: Orthopedics;  Laterality: Left;   NO PAST SURGERIES      Family History  Problem Relation Age of Onset   Healthy Mother    Hyperlipidemia Father    Hypertension Father    Lung cancer Maternal Grandfather        smoker   Stroke Paternal Grandmother        led to death mid 86s   Hypertension Paternal Grandmother    Dementia Maternal Grandmother    Glaucoma Paternal Grandfather     Medications- reviewed and updated Current Outpatient Medications  Medication Sig Dispense Refill   amLODipine  (NORVASC ) 5 MG tablet TAKE 1 TABLET BY MOUTH DAILY 90 tablet 3   escitalopram  (LEXAPRO ) 10 MG tablet TAKE 1 TABLET BY MOUTH DAILY 90 tablet 3   hydrochlorothiazide  (HYDRODIURIL ) 25 MG tablet Take 1 tablet (25 mg total)  by mouth daily. 90 tablet 2   Multiple Vitamin (MULTIVITAMIN WITH MINERALS) TABS tablet Take 1 tablet by mouth daily.     disulfiram  (ANTABUSE ) 250 MG tablet Take 1 tablet (250 mg total) by mouth daily. 90 tablet 3   No current facility-administered medications for this visit.    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       Lives with wife and 2 children   Right handed   Drinks no caffeine/ was drinking 1 high caffeine [employer] daily       Objective:  BP 128/82   Pulse 82   Temp (!) 97.5 F (36.4 C) (Temporal)   Ht 6' 3 (1.905 m)   Wt 210 lb 3.2 oz (95.3 kg)   SpO2 96%   BMI 26.27 kg/m  Gen: NAD, resting comfortably HEENT: Mucous membranes are moist. Oropharynx normal Neck: no thyromegaly CV: RRR no murmurs rubs or gallops Lungs: CTAB no crackles, wheeze, rhonchi Abdomen: soft/nontender/nondistended/normal bowel sounds. No rebound or guarding.  Ext: no edema Skin: warm, dry Neuro: grossly normal, moves all extremities, PERRLA     Assessment and Plan:  43 y.o. male presenting for annual physical.  Health Maintenance counseling: 1. Anticipatory guidance:  Patient counseled regarding regular dental exams -q6 months, eye exams - had evaluated in last year for first time in a while,  avoiding smoking and second hand smoke , limiting alcohol to 2 beverages per day- none since last visit, no illicit drugs- still some marijuana- discouraged use/encouraged cessation.   2. Risk factor reduction:  Advised patient of need for regular exercise and diet rich and fruits and vegetables to reduce risk of heart attack and stroke.  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  Diet/weight management-weight down 10 pounds from last visit-reducing alcohol likely helped. He  got concerned at one point on weight loss but with vacation did gain weight back and now more consistent around 210-215 but ok with losing 10 more lbs. With prior unintentional weight loss wants to check for blood in stool/worried about colon cancer risk- though with weight regain I suspect less likely Wt Readings from Last 3 Encounters:  05/14/24 210 lb 3.2 oz (95.3 kg)  11/23/23 220 lb 9.6 oz (100.1 kg)  08/24/23 216 lb 0.8 oz (98 kg)  3. Immunizations/screenings/ancillary studies-considering flu shot later in the season and otherwise up-to-date  Immunization History  Administered Date(s) Administered   Influenza,inj,Quad PF,6+ Mos 06/23/2019, 06/30/2020, 06/19/2021   Influenza-Unspecified 06/07/2016, 06/06/2018, 06/20/2023   PFIZER(Purple Top)SARS-COV-2 Vaccination 10/25/2019, 11/18/2019, 08/06/2020   Tdap 03/28/2016  4. Prostate cancer screening-  no family history, start at age 38   5. Colon cancer screening -  no family history, start at age 40 . No blood in stool but check stool cards as above 6. Skin cancer screening/prevention-no dermatologist. advised regular sunscreen use. Denies worrisome, changing, or new skin lesions.  7. Testicular cancer screening- advised monthly self exams  8. STD screening- patient opts out-only active with wife 9. Smoking associated screening-former smoker-quit in 2012 with just 6 pack years-no regular screening required though offered urine  Status of chronic or acute concerns   #hypertension S: medication: Amlodipine  5 mg, hydrochlorothiazide  25 mg -Cough on valsartan  in the past (3% chance of cough) Home readings #s: no recent checks BP Readings from Last 3 Encounters:  05/14/24 128/82  11/23/23 118/70  08/24/23 128/86  A/P: well controlled continue current medications   #hyperlipidemia S: Medication:None-ASCVD risk not significantly elevated but has had rather significant LDL elevations in 2023 -CT cardiac scoring of 0 on 01/28/2021, has also  seen cardiology in May 2022 (stopped marijuana after this) Lab Results  Component Value Date   CHOL 293 (H) 11/10/2021   HDL 80.40 11/10/2021   LDLCALC 182 (H) 11/10/2021   TRIG 151.0 (H) 11/10/2021   CHOLHDL 4 11/10/2021   A/P: update lipids and reevaluate-    # Anxiety S:Medication: Escitalopram  10 mg    05/14/2024    1:30 PM 05/16/2022    8:06 AM 08/17/2020   10:47 AM 04/28/2020    9:30 AM  GAD 7 : Generalized Anxiety Score  Nervous, Anxious, on Edge 0 0 1 1  Control/stop worrying 0 0 1 1  Worry too much - different things 0 0 1 1  Trouble relaxing 0 0 0 1  Restless 0 0 0 0  Easily annoyed or irritable 0 0 1 1  Afraid - awful might happen 0 0 0 1  Total GAD 7 Score 0 0 4 6  Anxiety Difficulty Not difficult at all Not difficult at all Not difficult at all Somewhat difficult  A/P: outside of doctors visit doing really well at home   #  Alcohol use disorder S: Working from home may alcohol more accessible to him during the daytime up to 10 or 11 shots during the daytime at its peak but never with the kids or family around -Was not a fan of AA in time would be challenging -We opted to use disulfiram - he has maintained on this  - no drinking since last visit in march! Stool looser without alcohol A/P: congratulated on his progress and we opted to continue   Recommended follow up: Return in about 6 months (around 11/11/2024) for followup or sooner if needed.Schedule b4 you leave.  Lab/Order associations:NOT fasting will come back fasting   ICD-10-CM   1. Preventative health care  Z00.00     2. Essential hypertension  I10 CBC w/Diff    Comp Met (CMET)    Lipid panel    Urinalysis, Routine w reflex microscopic    3. Alcohol use disorder  F10.90     4. GAD (generalized anxiety disorder)  F41.1     5. Screening for diabetes mellitus  Z13.1 HgB A1c    6. Overweight  E66.3 HgB A1c      Meds ordered this encounter  Medications   disulfiram  (ANTABUSE ) 250 MG tablet    Sig:  Take 1 tablet (250 mg total) by mouth daily.    Dispense:  90 tablet    Refill:  3    Return precautions advised.   Garnette Lukes, MD

## 2024-05-14 NOTE — Patient Instructions (Addendum)
 Schedule a lab visit at the check out desk within 2 weeks. Return for future fasting labs meaning nothing but water after midnight please. Ok to take your medications with water.   Please stop by lab before you go TO PICK UP STOOL CARDS If you have mychart- we will send your results within 3 business days of us  receiving them.  If you do not have mychart- we will call you about results within 5 business days of us  receiving them.  *please also note that you will see labs on mychart as soon as they post. I will later go in and write notes on them- will say notes from Dr. Katrinka   Recommended follow up: Return in about 6 months (around 11/11/2024) for followup or sooner if needed.Schedule b4 you leave.

## 2024-05-17 ENCOUNTER — Ambulatory Visit: Payer: Self-pay | Admitting: Family Medicine

## 2024-05-17 ENCOUNTER — Other Ambulatory Visit (INDEPENDENT_AMBULATORY_CARE_PROVIDER_SITE_OTHER)

## 2024-05-17 DIAGNOSIS — Z131 Encounter for screening for diabetes mellitus: Secondary | ICD-10-CM

## 2024-05-17 DIAGNOSIS — R634 Abnormal weight loss: Secondary | ICD-10-CM

## 2024-05-17 DIAGNOSIS — I10 Essential (primary) hypertension: Secondary | ICD-10-CM | POA: Diagnosis not present

## 2024-05-17 DIAGNOSIS — E663 Overweight: Secondary | ICD-10-CM

## 2024-05-17 LAB — CBC WITH DIFFERENTIAL/PLATELET
Basophils Absolute: 0 K/uL (ref 0.0–0.1)
Basophils Relative: 0.8 % (ref 0.0–3.0)
Eosinophils Absolute: 0.1 K/uL (ref 0.0–0.7)
Eosinophils Relative: 1.1 % (ref 0.0–5.0)
HCT: 48.5 % (ref 39.0–52.0)
Hemoglobin: 16.2 g/dL (ref 13.0–17.0)
Lymphocytes Relative: 28.1 % (ref 12.0–46.0)
Lymphs Abs: 1.7 K/uL (ref 0.7–4.0)
MCHC: 33.4 g/dL (ref 30.0–36.0)
MCV: 90 fl (ref 78.0–100.0)
Monocytes Absolute: 0.6 K/uL (ref 0.1–1.0)
Monocytes Relative: 9.4 % (ref 3.0–12.0)
Neutro Abs: 3.6 K/uL (ref 1.4–7.7)
Neutrophils Relative %: 60.6 % (ref 43.0–77.0)
Platelets: 245 K/uL (ref 150.0–400.0)
RBC: 5.4 Mil/uL (ref 4.22–5.81)
RDW: 13.4 % (ref 11.5–15.5)
WBC: 6 K/uL (ref 4.0–10.5)

## 2024-05-17 LAB — URINALYSIS, ROUTINE W REFLEX MICROSCOPIC
Bilirubin Urine: NEGATIVE
Hgb urine dipstick: NEGATIVE
Ketones, ur: NEGATIVE
Leukocytes,Ua: NEGATIVE
Nitrite: NEGATIVE
Specific Gravity, Urine: 1.02 (ref 1.000–1.030)
Total Protein, Urine: NEGATIVE
Urine Glucose: NEGATIVE
Urobilinogen, UA: 0.2 (ref 0.0–1.0)
pH: 7.5 (ref 5.0–8.0)

## 2024-05-17 LAB — COMPREHENSIVE METABOLIC PANEL WITH GFR
ALT: 22 U/L (ref 0–53)
AST: 30 U/L (ref 0–37)
Albumin: 5 g/dL (ref 3.5–5.2)
Alkaline Phosphatase: 75 U/L (ref 39–117)
BUN: 14 mg/dL (ref 6–23)
CO2: 30 meq/L (ref 19–32)
Calcium: 10.1 mg/dL (ref 8.4–10.5)
Chloride: 100 meq/L (ref 96–112)
Creatinine, Ser: 1.05 mg/dL (ref 0.40–1.50)
GFR: 87.71 mL/min (ref >60.00)
Glucose, Bld: 82 mg/dL (ref 70–99)
Potassium: 3.8 meq/L (ref 3.5–5.1)
Sodium: 138 meq/L (ref 135–145)
Total Bilirubin: 1.3 mg/dL — ABNORMAL HIGH (ref 0.2–1.2)
Total Protein: 8.2 g/dL (ref 6.0–8.3)

## 2024-05-17 LAB — HEMOGLOBIN A1C: Hgb A1c MFr Bld: 5.6 % (ref 4.6–6.5)

## 2024-05-17 LAB — FECAL OCCULT BLOOD, IMMUNOCHEMICAL: Fecal Occult Bld: NEGATIVE

## 2024-05-18 LAB — LIPID PANEL
Cholesterol: 240 mg/dL — ABNORMAL HIGH (ref <200)
HDL: 54 mg/dL (ref >=40)
LDL Cholesterol (Calc): 169 mg/dL — ABNORMAL HIGH
Non-HDL Cholesterol (Calc): 186 mg/dL — ABNORMAL HIGH (ref <130)
Total CHOL/HDL Ratio: 4.4 (calc) (ref <5.0)
Triglycerides: 72 mg/dL (ref <150)

## 2024-05-24 ENCOUNTER — Other Ambulatory Visit (HOSPITAL_BASED_OUTPATIENT_CLINIC_OR_DEPARTMENT_OTHER): Payer: Self-pay

## 2024-06-05 ENCOUNTER — Other Ambulatory Visit (HOSPITAL_BASED_OUTPATIENT_CLINIC_OR_DEPARTMENT_OTHER): Payer: Self-pay

## 2024-07-01 ENCOUNTER — Other Ambulatory Visit (HOSPITAL_BASED_OUTPATIENT_CLINIC_OR_DEPARTMENT_OTHER): Payer: Self-pay

## 2024-07-31 ENCOUNTER — Other Ambulatory Visit (HOSPITAL_BASED_OUTPATIENT_CLINIC_OR_DEPARTMENT_OTHER): Payer: Self-pay

## 2024-08-03 ENCOUNTER — Other Ambulatory Visit (HOSPITAL_BASED_OUTPATIENT_CLINIC_OR_DEPARTMENT_OTHER): Payer: Self-pay

## 2024-08-12 ENCOUNTER — Other Ambulatory Visit (HOSPITAL_BASED_OUTPATIENT_CLINIC_OR_DEPARTMENT_OTHER): Payer: Self-pay

## 2024-08-12 ENCOUNTER — Other Ambulatory Visit: Payer: Self-pay | Admitting: Family Medicine

## 2024-08-12 MED ORDER — HYDROCHLOROTHIAZIDE 25 MG PO TABS
25.0000 mg | ORAL_TABLET | Freq: Every day | ORAL | 2 refills | Status: AC
  Filled 2024-08-12: qty 30, 30d supply, fill #0
  Filled 2024-09-06: qty 30, 30d supply, fill #1

## 2024-09-01 ENCOUNTER — Other Ambulatory Visit (HOSPITAL_BASED_OUTPATIENT_CLINIC_OR_DEPARTMENT_OTHER): Payer: Self-pay

## 2024-09-12 ENCOUNTER — Other Ambulatory Visit (HOSPITAL_BASED_OUTPATIENT_CLINIC_OR_DEPARTMENT_OTHER): Payer: Self-pay

## 2024-09-17 ENCOUNTER — Other Ambulatory Visit (HOSPITAL_BASED_OUTPATIENT_CLINIC_OR_DEPARTMENT_OTHER): Payer: Self-pay

## 2024-11-11 ENCOUNTER — Ambulatory Visit: Admitting: Family Medicine
